# Patient Record
Sex: Female | Born: 1985 | Race: Black or African American | Hispanic: No | Marital: Single | Smoking: Current every day smoker
Health system: Southern US, Community
[De-identification: ages and names within clinical notes are randomized; demographics above are authoritative.]

## PROBLEM LIST (undated history)

## (undated) DIAGNOSIS — F319 Bipolar disorder, unspecified: Secondary | ICD-10-CM

## (undated) DIAGNOSIS — E119 Type 2 diabetes mellitus without complications: Secondary | ICD-10-CM

## (undated) DIAGNOSIS — M419 Scoliosis, unspecified: Secondary | ICD-10-CM

## (undated) DIAGNOSIS — D573 Sickle-cell trait: Secondary | ICD-10-CM

## (undated) DIAGNOSIS — J45909 Unspecified asthma, uncomplicated: Secondary | ICD-10-CM

## (undated) DIAGNOSIS — D571 Sickle-cell disease without crisis: Secondary | ICD-10-CM

## (undated) DIAGNOSIS — Z9851 Tubal ligation status: Secondary | ICD-10-CM

## (undated) HISTORY — PX: TUBAL LIGATION: SHX77

---

## 2005-12-09 ENCOUNTER — Emergency Department (HOSPITAL_COMMUNITY): Admission: EM | Admit: 2005-12-09 | Discharge: 2005-12-09 | Payer: Self-pay | Admitting: Emergency Medicine

## 2005-12-09 ENCOUNTER — Encounter: Payer: Self-pay | Admitting: Emergency Medicine

## 2006-03-13 ENCOUNTER — Emergency Department (HOSPITAL_COMMUNITY): Admission: EM | Admit: 2006-03-13 | Discharge: 2006-03-14 | Payer: Self-pay | Admitting: Emergency Medicine

## 2006-03-14 ENCOUNTER — Ambulatory Visit: Payer: Self-pay | Admitting: *Deleted

## 2006-03-14 ENCOUNTER — Encounter (INDEPENDENT_AMBULATORY_CARE_PROVIDER_SITE_OTHER): Payer: Self-pay | Admitting: Cardiology

## 2006-03-14 ENCOUNTER — Encounter (INDEPENDENT_AMBULATORY_CARE_PROVIDER_SITE_OTHER): Payer: Self-pay | Admitting: *Deleted

## 2006-03-14 ENCOUNTER — Inpatient Hospital Stay (HOSPITAL_COMMUNITY): Admission: EM | Admit: 2006-03-14 | Discharge: 2006-03-19 | Payer: Self-pay | Admitting: Emergency Medicine

## 2006-03-29 ENCOUNTER — Encounter (INDEPENDENT_AMBULATORY_CARE_PROVIDER_SITE_OTHER): Payer: Self-pay | Admitting: *Deleted

## 2006-03-29 ENCOUNTER — Ambulatory Visit: Payer: Self-pay | Admitting: Hospitalist

## 2006-03-29 DIAGNOSIS — F17200 Nicotine dependence, unspecified, uncomplicated: Secondary | ICD-10-CM

## 2006-03-29 DIAGNOSIS — E876 Hypokalemia: Secondary | ICD-10-CM

## 2006-03-29 DIAGNOSIS — N926 Irregular menstruation, unspecified: Secondary | ICD-10-CM | POA: Insufficient documentation

## 2006-03-29 DIAGNOSIS — N939 Abnormal uterine and vaginal bleeding, unspecified: Secondary | ICD-10-CM

## 2006-03-29 DIAGNOSIS — T7411XA Adult physical abuse, confirmed, initial encounter: Secondary | ICD-10-CM

## 2006-03-29 LAB — CONVERTED CEMR LAB
BUN: 1 mg/dL — ABNORMAL LOW (ref 6–23)
Creatinine, Ser: 0.64 mg/dL (ref 0.40–1.20)
Potassium: 3.5 meq/L (ref 3.5–5.3)

## 2006-03-30 ENCOUNTER — Telehealth (INDEPENDENT_AMBULATORY_CARE_PROVIDER_SITE_OTHER): Payer: Self-pay | Admitting: *Deleted

## 2006-03-30 ENCOUNTER — Ambulatory Visit: Payer: Self-pay | Admitting: Hematology & Oncology

## 2006-03-30 ENCOUNTER — Encounter (INDEPENDENT_AMBULATORY_CARE_PROVIDER_SITE_OTHER): Payer: Self-pay | Admitting: *Deleted

## 2006-03-30 LAB — CONVERTED CEMR LAB: Magnesium: 1.8 mg/dL (ref 1.5–2.5)

## 2006-04-12 ENCOUNTER — Encounter (INDEPENDENT_AMBULATORY_CARE_PROVIDER_SITE_OTHER): Payer: Self-pay | Admitting: *Deleted

## 2006-04-12 ENCOUNTER — Ambulatory Visit: Payer: Self-pay | Admitting: Internal Medicine

## 2006-04-13 LAB — CONVERTED CEMR LAB
CO2: 27 meq/L (ref 19–32)
Chloride: 106 meq/L (ref 96–112)
Glucose, Bld: 87 mg/dL (ref 70–99)
HCT: 33.3 % — ABNORMAL LOW (ref 36.0–46.0)
Lymphocytes Relative: 35 % (ref 12–46)
Lymphs Abs: 2.1 10*3/uL (ref 0.7–3.3)
Neutrophils Relative %: 53 % (ref 43–77)
Platelets: 380 10*3/uL (ref 150–400)
Potassium: 3.3 meq/L — ABNORMAL LOW (ref 3.5–5.3)
Sodium: 139 meq/L (ref 135–145)
WBC: 5.9 10*3/uL (ref 4.0–10.5)

## 2006-05-11 ENCOUNTER — Telehealth (INDEPENDENT_AMBULATORY_CARE_PROVIDER_SITE_OTHER): Payer: Self-pay | Admitting: Internal Medicine

## 2006-05-11 ENCOUNTER — Emergency Department (HOSPITAL_COMMUNITY): Admission: EM | Admit: 2006-05-11 | Discharge: 2006-05-11 | Payer: Self-pay | Admitting: Emergency Medicine

## 2006-05-12 ENCOUNTER — Telehealth: Payer: Self-pay | Admitting: *Deleted

## 2006-05-13 ENCOUNTER — Telehealth (INDEPENDENT_AMBULATORY_CARE_PROVIDER_SITE_OTHER): Payer: Self-pay | Admitting: Internal Medicine

## 2006-05-13 ENCOUNTER — Inpatient Hospital Stay (HOSPITAL_COMMUNITY): Admission: EM | Admit: 2006-05-13 | Discharge: 2006-05-15 | Payer: Self-pay | Admitting: Emergency Medicine

## 2006-05-18 ENCOUNTER — Telehealth: Payer: Self-pay | Admitting: *Deleted

## 2006-05-31 ENCOUNTER — Emergency Department (HOSPITAL_COMMUNITY): Admission: EM | Admit: 2006-05-31 | Discharge: 2006-06-01 | Payer: Self-pay | Admitting: Emergency Medicine

## 2006-06-08 ENCOUNTER — Emergency Department (HOSPITAL_COMMUNITY): Admission: EM | Admit: 2006-06-08 | Discharge: 2006-06-09 | Payer: Self-pay | Admitting: Emergency Medicine

## 2006-06-11 ENCOUNTER — Inpatient Hospital Stay (HOSPITAL_COMMUNITY): Admission: AD | Admit: 2006-06-11 | Discharge: 2006-06-12 | Payer: Self-pay | Admitting: Family Medicine

## 2006-07-19 ENCOUNTER — Inpatient Hospital Stay (HOSPITAL_COMMUNITY): Admission: EM | Admit: 2006-07-19 | Discharge: 2006-07-19 | Payer: Self-pay | Admitting: Obstetrics & Gynecology

## 2006-08-10 ENCOUNTER — Encounter: Payer: Self-pay | Admitting: Obstetrics & Gynecology

## 2006-08-10 ENCOUNTER — Ambulatory Visit: Payer: Self-pay | Admitting: Obstetrics & Gynecology

## 2006-09-08 ENCOUNTER — Ambulatory Visit: Payer: Self-pay | Admitting: Gynecology

## 2006-09-12 ENCOUNTER — Ambulatory Visit (HOSPITAL_COMMUNITY): Admission: RE | Admit: 2006-09-12 | Discharge: 2006-09-12 | Payer: Self-pay | Admitting: Family Medicine

## 2006-09-28 ENCOUNTER — Ambulatory Visit: Payer: Self-pay | Admitting: Gynecology

## 2006-09-29 ENCOUNTER — Ambulatory Visit: Payer: Self-pay | Admitting: *Deleted

## 2006-10-20 ENCOUNTER — Ambulatory Visit: Payer: Self-pay | Admitting: Family Medicine

## 2006-10-30 ENCOUNTER — Observation Stay (HOSPITAL_COMMUNITY): Admission: AD | Admit: 2006-10-30 | Discharge: 2006-10-31 | Payer: Self-pay | Admitting: Obstetrics & Gynecology

## 2006-10-30 ENCOUNTER — Ambulatory Visit: Payer: Self-pay | Admitting: Obstetrics & Gynecology

## 2006-10-30 ENCOUNTER — Encounter: Payer: Self-pay | Admitting: Emergency Medicine

## 2006-10-31 ENCOUNTER — Ambulatory Visit: Payer: Self-pay | Admitting: Hematology & Oncology

## 2006-11-17 ENCOUNTER — Ambulatory Visit: Payer: Self-pay | Admitting: Obstetrics & Gynecology

## 2006-11-26 ENCOUNTER — Inpatient Hospital Stay (HOSPITAL_COMMUNITY): Admission: AD | Admit: 2006-11-26 | Discharge: 2006-11-26 | Payer: Self-pay | Admitting: Family Medicine

## 2006-11-26 ENCOUNTER — Ambulatory Visit: Payer: Self-pay | Admitting: Obstetrics and Gynecology

## 2006-12-08 ENCOUNTER — Inpatient Hospital Stay (HOSPITAL_COMMUNITY): Admission: AD | Admit: 2006-12-08 | Discharge: 2006-12-08 | Payer: Self-pay | Admitting: Family Medicine

## 2006-12-08 ENCOUNTER — Ambulatory Visit: Payer: Self-pay | Admitting: Obstetrics and Gynecology

## 2007-01-05 ENCOUNTER — Inpatient Hospital Stay (HOSPITAL_COMMUNITY): Admission: AD | Admit: 2007-01-05 | Discharge: 2007-01-06 | Payer: Self-pay | Admitting: *Deleted

## 2007-01-05 ENCOUNTER — Ambulatory Visit: Payer: Self-pay | Admitting: *Deleted

## 2007-01-25 ENCOUNTER — Inpatient Hospital Stay (HOSPITAL_COMMUNITY): Admission: AD | Admit: 2007-01-25 | Discharge: 2007-01-26 | Payer: Self-pay | Admitting: Obstetrics and Gynecology

## 2007-01-30 ENCOUNTER — Ambulatory Visit (HOSPITAL_COMMUNITY): Admission: RE | Admit: 2007-01-30 | Discharge: 2007-01-30 | Payer: Self-pay | Admitting: Obstetrics & Gynecology

## 2007-01-30 ENCOUNTER — Ambulatory Visit: Payer: Self-pay | Admitting: Obstetrics & Gynecology

## 2007-02-01 ENCOUNTER — Inpatient Hospital Stay (HOSPITAL_COMMUNITY): Admission: AD | Admit: 2007-02-01 | Discharge: 2007-02-04 | Payer: Self-pay | Admitting: Obstetrics and Gynecology

## 2007-02-01 ENCOUNTER — Ambulatory Visit: Payer: Self-pay | Admitting: Obstetrics & Gynecology

## 2007-03-16 ENCOUNTER — Ambulatory Visit: Payer: Self-pay | Admitting: Obstetrics & Gynecology

## 2007-03-16 ENCOUNTER — Inpatient Hospital Stay (HOSPITAL_COMMUNITY): Admission: AD | Admit: 2007-03-16 | Discharge: 2007-03-16 | Payer: Self-pay | Admitting: Obstetrics & Gynecology

## 2007-06-15 ENCOUNTER — Ambulatory Visit: Payer: Self-pay | Admitting: Obstetrics and Gynecology

## 2007-09-09 ENCOUNTER — Inpatient Hospital Stay (HOSPITAL_COMMUNITY): Admission: EM | Admit: 2007-09-09 | Discharge: 2007-09-12 | Payer: Self-pay | Admitting: Emergency Medicine

## 2007-09-12 ENCOUNTER — Encounter (INDEPENDENT_AMBULATORY_CARE_PROVIDER_SITE_OTHER): Payer: Self-pay | Admitting: *Deleted

## 2007-09-12 DIAGNOSIS — D573 Sickle-cell trait: Secondary | ICD-10-CM

## 2007-09-12 DIAGNOSIS — K429 Umbilical hernia without obstruction or gangrene: Secondary | ICD-10-CM | POA: Insufficient documentation

## 2007-09-12 DIAGNOSIS — N898 Other specified noninflammatory disorders of vagina: Secondary | ICD-10-CM | POA: Insufficient documentation

## 2007-09-21 ENCOUNTER — Emergency Department (HOSPITAL_COMMUNITY): Admission: EM | Admit: 2007-09-21 | Discharge: 2007-09-22 | Payer: Self-pay | Admitting: Emergency Medicine

## 2007-12-09 ENCOUNTER — Emergency Department (HOSPITAL_COMMUNITY): Admission: EM | Admit: 2007-12-09 | Discharge: 2007-12-10 | Payer: Self-pay | Admitting: Emergency Medicine

## 2008-02-10 ENCOUNTER — Emergency Department (HOSPITAL_COMMUNITY): Admission: EM | Admit: 2008-02-10 | Discharge: 2008-02-11 | Payer: Self-pay | Admitting: Emergency Medicine

## 2008-02-17 ENCOUNTER — Emergency Department (HOSPITAL_COMMUNITY): Admission: EM | Admit: 2008-02-17 | Discharge: 2008-02-18 | Payer: Self-pay | Admitting: Emergency Medicine

## 2008-03-11 ENCOUNTER — Inpatient Hospital Stay (HOSPITAL_COMMUNITY): Admission: EM | Admit: 2008-03-11 | Discharge: 2008-03-15 | Payer: Self-pay | Admitting: Emergency Medicine

## 2008-03-22 ENCOUNTER — Emergency Department (HOSPITAL_COMMUNITY): Admission: EM | Admit: 2008-03-22 | Discharge: 2008-03-22 | Payer: Self-pay | Admitting: Emergency Medicine

## 2008-03-26 ENCOUNTER — Emergency Department (HOSPITAL_COMMUNITY): Admission: EM | Admit: 2008-03-26 | Discharge: 2008-03-27 | Payer: Self-pay | Admitting: Emergency Medicine

## 2008-03-27 ENCOUNTER — Emergency Department (HOSPITAL_COMMUNITY): Admission: EM | Admit: 2008-03-27 | Discharge: 2008-03-28 | Payer: Self-pay | Admitting: Emergency Medicine

## 2008-04-14 ENCOUNTER — Emergency Department (HOSPITAL_COMMUNITY): Admission: EM | Admit: 2008-04-14 | Discharge: 2008-04-14 | Payer: Self-pay | Admitting: Emergency Medicine

## 2008-04-26 ENCOUNTER — Emergency Department (HOSPITAL_COMMUNITY): Admission: EM | Admit: 2008-04-26 | Discharge: 2008-04-26 | Payer: Self-pay | Admitting: *Deleted

## 2008-05-27 ENCOUNTER — Emergency Department (HOSPITAL_COMMUNITY): Admission: EM | Admit: 2008-05-27 | Discharge: 2008-05-27 | Payer: Self-pay | Admitting: Emergency Medicine

## 2008-08-07 ENCOUNTER — Emergency Department (HOSPITAL_COMMUNITY): Admission: EM | Admit: 2008-08-07 | Discharge: 2008-08-07 | Payer: Self-pay | Admitting: Emergency Medicine

## 2008-09-06 ENCOUNTER — Emergency Department (HOSPITAL_COMMUNITY): Admission: EM | Admit: 2008-09-06 | Discharge: 2008-09-06 | Payer: Self-pay | Admitting: Emergency Medicine

## 2008-10-12 ENCOUNTER — Emergency Department (HOSPITAL_COMMUNITY): Admission: EM | Admit: 2008-10-12 | Discharge: 2008-10-13 | Payer: Self-pay | Admitting: Emergency Medicine

## 2008-11-03 ENCOUNTER — Emergency Department (HOSPITAL_COMMUNITY): Admission: EM | Admit: 2008-11-03 | Discharge: 2008-11-03 | Payer: Self-pay | Admitting: Emergency Medicine

## 2009-01-04 ENCOUNTER — Emergency Department (HOSPITAL_COMMUNITY): Admission: EM | Admit: 2009-01-04 | Discharge: 2009-01-04 | Payer: Self-pay | Admitting: Emergency Medicine

## 2009-01-22 ENCOUNTER — Emergency Department (HOSPITAL_COMMUNITY): Admission: EM | Admit: 2009-01-22 | Discharge: 2009-01-22 | Payer: Self-pay | Admitting: Emergency Medicine

## 2009-01-23 ENCOUNTER — Emergency Department (HOSPITAL_COMMUNITY): Admission: EM | Admit: 2009-01-23 | Discharge: 2009-01-23 | Payer: Self-pay | Admitting: Emergency Medicine

## 2009-04-12 ENCOUNTER — Inpatient Hospital Stay (HOSPITAL_COMMUNITY): Admission: AD | Admit: 2009-04-12 | Discharge: 2009-04-12 | Payer: Self-pay | Admitting: Obstetrics and Gynecology

## 2009-08-31 ENCOUNTER — Inpatient Hospital Stay (HOSPITAL_COMMUNITY): Admission: EM | Admit: 2009-08-31 | Discharge: 2009-09-02 | Payer: Self-pay | Admitting: Emergency Medicine

## 2009-09-02 ENCOUNTER — Ambulatory Visit: Payer: Self-pay | Admitting: Vascular Surgery

## 2009-09-02 ENCOUNTER — Encounter (INDEPENDENT_AMBULATORY_CARE_PROVIDER_SITE_OTHER): Payer: Self-pay | Admitting: Internal Medicine

## 2009-10-27 ENCOUNTER — Emergency Department (HOSPITAL_COMMUNITY): Admission: EM | Admit: 2009-10-27 | Discharge: 2009-10-27 | Payer: Self-pay | Admitting: Emergency Medicine

## 2009-11-22 ENCOUNTER — Emergency Department (HOSPITAL_COMMUNITY): Admission: EM | Admit: 2009-11-22 | Discharge: 2009-11-22 | Payer: Self-pay | Admitting: Emergency Medicine

## 2010-03-08 ENCOUNTER — Encounter: Payer: Self-pay | Admitting: Family Medicine

## 2010-04-30 LAB — HCG, SERUM, QUALITATIVE: Preg, Serum: NEGATIVE

## 2010-04-30 LAB — URINALYSIS, ROUTINE W REFLEX MICROSCOPIC
Bilirubin Urine: NEGATIVE
Glucose, UA: NEGATIVE mg/dL
Hgb urine dipstick: NEGATIVE
Nitrite: NEGATIVE
Specific Gravity, Urine: 1.018 (ref 1.005–1.030)
Urobilinogen, UA: 0.2 mg/dL (ref 0.0–1.0)
pH: 6.5 (ref 5.0–8.0)

## 2010-04-30 LAB — RAPID URINE DRUG SCREEN, HOSP PERFORMED
Barbiturates: NOT DETECTED
Opiates: NOT DETECTED
Tetrahydrocannabinol: POSITIVE — AB

## 2010-04-30 LAB — CBC
MCH: 28 pg (ref 26.0–34.0)
MCHC: 33.3 g/dL (ref 30.0–36.0)
MCV: 84 fL (ref 78.0–100.0)
Platelets: 255 10*3/uL (ref 150–400)
RBC: 4.16 MIL/uL (ref 3.87–5.11)

## 2010-04-30 LAB — POCT I-STAT, CHEM 8
BUN: 3 mg/dL — ABNORMAL LOW (ref 6–23)
Calcium, Ion: 1.12 mmol/L (ref 1.12–1.32)
Chloride: 105 mEq/L (ref 96–112)
Glucose, Bld: 83 mg/dL (ref 70–99)

## 2010-04-30 LAB — BASIC METABOLIC PANEL
CO2: 27 mEq/L (ref 19–32)
Calcium: 7.9 mg/dL — ABNORMAL LOW (ref 8.4–10.5)
Chloride: 112 mEq/L (ref 96–112)
Creatinine, Ser: 0.69 mg/dL (ref 0.4–1.2)
Glucose, Bld: 88 mg/dL (ref 70–99)

## 2010-04-30 LAB — URINE MICROSCOPIC-ADD ON

## 2010-05-02 LAB — DIFFERENTIAL
Basophils Absolute: 0 10*3/uL (ref 0.0–0.1)
Basophils Relative: 0 % (ref 0–1)
Eosinophils Absolute: 0.1 10*3/uL (ref 0.0–0.7)
Eosinophils Relative: 1 % (ref 0–5)
Monocytes Absolute: 0.7 10*3/uL (ref 0.1–1.0)
Monocytes Relative: 7 % (ref 3–12)
Neutro Abs: 6 10*3/uL (ref 1.7–7.7)

## 2010-05-02 LAB — URINALYSIS, ROUTINE W REFLEX MICROSCOPIC
Glucose, UA: NEGATIVE mg/dL
Ketones, ur: 40 mg/dL — AB
Nitrite: NEGATIVE
Specific Gravity, Urine: 1.023 (ref 1.005–1.030)
pH: 5.5 (ref 5.0–8.0)

## 2010-05-02 LAB — BASIC METABOLIC PANEL
BUN: 2 mg/dL — ABNORMAL LOW (ref 6–23)
BUN: 9 mg/dL (ref 6–23)
CO2: 22 mEq/L (ref 19–32)
CO2: 25 mEq/L (ref 19–32)
Calcium: 8.1 mg/dL — ABNORMAL LOW (ref 8.4–10.5)
Creatinine, Ser: 0.81 mg/dL (ref 0.4–1.2)
GFR calc non Af Amer: >60 mL/min (ref >60)
GFR calc non Af Amer: >60 mL/min (ref >60)
Glucose, Bld: 75 mg/dL (ref 70–99)
Glucose, Bld: 93 mg/dL (ref 70–99)
Potassium: 3.2 mEq/L — ABNORMAL LOW (ref 3.5–5.1)
Sodium: 138 mEq/L (ref 135–145)
Sodium: 139 mEq/L (ref 135–145)

## 2010-05-02 LAB — CARDIAC PANEL(CRET KIN+CKTOT+MB+TROPI)
CK, MB: 1.1 ng/mL (ref 0.3–4.0)
Relative Index: 0.7 (ref 0.0–2.5)
Troponin I: 0.03 ng/mL (ref 0.00–0.06)

## 2010-05-02 LAB — CBC
HCT: 31.9 % — ABNORMAL LOW (ref 36.0–46.0)
HCT: 36.8 % (ref 36.0–46.0)
Hemoglobin: 10.4 g/dL — ABNORMAL LOW (ref 12.0–15.0)
Hemoglobin: 12.3 g/dL (ref 12.0–15.0)
MCH: 28.1 pg (ref 26.0–34.0)
MCH: 28.7 pg (ref 26.0–34.0)
MCHC: 32.6 g/dL (ref 30.0–36.0)
MCHC: 33.5 g/dL (ref 30.0–36.0)
MCV: 86.2 fL (ref 78.0–100.0)
RBC: 3.7 MIL/uL — ABNORMAL LOW (ref 3.87–5.11)
RDW: 18.2 % — ABNORMAL HIGH (ref 11.5–15.5)

## 2010-05-02 LAB — COMPREHENSIVE METABOLIC PANEL
Albumin: 3 g/dL — ABNORMAL LOW (ref 3.5–5.2)
BUN: 4 mg/dL — ABNORMAL LOW (ref 6–23)
Chloride: 113 mEq/L — ABNORMAL HIGH (ref 96–112)
Creatinine, Ser: 0.81 mg/dL (ref 0.4–1.2)
GFR calc Af Amer: >60 mL/min (ref >60)
Glucose, Bld: 99 mg/dL (ref 70–99)
Potassium: 3.9 mEq/L (ref 3.5–5.1)
Sodium: 141 mEq/L (ref 135–145)
Total Bilirubin: 0.5 mg/dL (ref 0.3–1.2)
Total Protein: 5.8 g/dL — ABNORMAL LOW (ref 6.0–8.3)

## 2010-05-02 LAB — RAPID URINE DRUG SCREEN, HOSP PERFORMED: Cocaine: NOT DETECTED

## 2010-05-02 LAB — MAGNESIUM: Magnesium: 1.7 mg/dL (ref 1.5–2.5)

## 2010-05-02 LAB — POCT CARDIAC MARKERS: Myoglobin, poc: 81.9 ng/mL (ref 12–200)

## 2010-05-02 LAB — TROPONIN I: Troponin I: 0.01 ng/mL (ref 0.00–0.06)

## 2010-05-02 LAB — URINE MICROSCOPIC-ADD ON

## 2010-05-02 LAB — CK TOTAL AND CKMB (NOT AT ARMC): Total CK: 146 U/L (ref 7–177)

## 2010-05-02 LAB — MRSA PCR SCREENING: MRSA by PCR: POSITIVE — AB

## 2010-05-02 LAB — POCT PREGNANCY, URINE: Preg Test, Ur: NEGATIVE

## 2010-05-02 LAB — GC/CHLAMYDIA PROBE AMP, URINE: GC Probe Amp, Urine: NEGATIVE

## 2010-05-06 LAB — URINALYSIS, ROUTINE W REFLEX MICROSCOPIC
Ketones, ur: NEGATIVE mg/dL
Leukocytes, UA: NEGATIVE
Nitrite: NEGATIVE
Specific Gravity, Urine: 1.01 (ref 1.005–1.030)
Urobilinogen, UA: 0.2 mg/dL (ref 0.0–1.0)
pH: 7.5 (ref 5.0–8.0)

## 2010-05-06 LAB — WET PREP, GENITAL
Clue Cells Wet Prep HPF POC: NONE SEEN
Yeast Wet Prep HPF POC: NONE SEEN

## 2010-05-06 LAB — CBC
Platelets: 305 10*3/uL (ref 150–400)
RBC: 4.05 MIL/uL (ref 3.87–5.11)
WBC: 6.6 10*3/uL (ref 4.0–10.5)

## 2010-05-06 LAB — URINE MICROSCOPIC-ADD ON

## 2010-05-06 LAB — SAMPLE TO BLOOD BANK

## 2010-05-06 LAB — GC/CHLAMYDIA PROBE AMP, GENITAL: Chlamydia, DNA Probe: POSITIVE — AB

## 2010-05-06 LAB — POCT PREGNANCY, URINE: Preg Test, Ur: NEGATIVE

## 2010-05-19 LAB — URINALYSIS, ROUTINE W REFLEX MICROSCOPIC
Glucose, UA: NEGATIVE mg/dL
Ketones, ur: NEGATIVE mg/dL
Nitrite: NEGATIVE
Protein, ur: NEGATIVE mg/dL
Urobilinogen, UA: 0.2 mg/dL (ref 0.0–1.0)

## 2010-05-19 LAB — RETICULOCYTES
RBC.: 4.31 MIL/uL (ref 3.87–5.11)
Retic Count, Absolute: 60.3 10*3/uL (ref 19.0–186.0)
Retic Ct Pct: 1.4 % (ref 0.4–3.1)

## 2010-05-19 LAB — DIFFERENTIAL
Basophils Absolute: 0.1 10*3/uL (ref 0.0–0.1)
Lymphocytes Relative: 20 % (ref 12–46)
Lymphs Abs: 1.9 10*3/uL (ref 0.7–4.0)
Neutro Abs: 6.5 10*3/uL (ref 1.7–7.7)
Neutrophils Relative %: 71 % (ref 43–77)

## 2010-05-19 LAB — BASIC METABOLIC PANEL
BUN: 7 mg/dL (ref 6–23)
Calcium: 8.6 mg/dL (ref 8.4–10.5)
GFR calc non Af Amer: >60 mL/min (ref >60)
Glucose, Bld: 89 mg/dL (ref 70–99)
Potassium: 4.1 mEq/L (ref 3.5–5.1)

## 2010-05-19 LAB — CBC
HCT: 36.7 % (ref 36.0–46.0)
Platelets: 366 10*3/uL (ref 150–400)
RDW: 15.9 % — ABNORMAL HIGH (ref 11.5–15.5)
WBC: 9.2 10*3/uL (ref 4.0–10.5)

## 2010-05-19 LAB — URINE MICROSCOPIC-ADD ON

## 2010-05-19 LAB — POCT PREGNANCY, URINE: Preg Test, Ur: NEGATIVE

## 2010-05-20 LAB — URINALYSIS, ROUTINE W REFLEX MICROSCOPIC
Bilirubin Urine: NEGATIVE
Glucose, UA: NEGATIVE mg/dL
Ketones, ur: NEGATIVE mg/dL
Nitrite: NEGATIVE
Protein, ur: NEGATIVE mg/dL
pH: 8 (ref 5.0–8.0)

## 2010-05-20 LAB — WET PREP, GENITAL
Clue Cells Wet Prep HPF POC: NONE SEEN
Trich, Wet Prep: NONE SEEN
Yeast Wet Prep HPF POC: NONE SEEN

## 2010-05-20 LAB — POCT PREGNANCY, URINE: Preg Test, Ur: NEGATIVE

## 2010-05-22 LAB — URINALYSIS, ROUTINE W REFLEX MICROSCOPIC
Bilirubin Urine: NEGATIVE
Glucose, UA: NEGATIVE mg/dL
Hgb urine dipstick: NEGATIVE
Specific Gravity, Urine: 1.016 (ref 1.005–1.030)

## 2010-05-22 LAB — GC/CHLAMYDIA PROBE AMP, GENITAL
Chlamydia, DNA Probe: NEGATIVE
GC Probe Amp, Genital: NEGATIVE

## 2010-05-22 LAB — WET PREP, GENITAL

## 2010-05-23 LAB — CBC
HCT: 36.4 % (ref 36.0–46.0)
MCV: 83.5 fL (ref 78.0–100.0)
Platelets: 233 10*3/uL (ref 150–400)
RDW: 17.1 % — ABNORMAL HIGH (ref 11.5–15.5)

## 2010-05-23 LAB — DIFFERENTIAL
Basophils Absolute: 0 10*3/uL (ref 0.0–0.1)
Basophils Relative: 0 % (ref 0–1)
Eosinophils Absolute: 0 10*3/uL (ref 0.0–0.7)
Eosinophils Relative: 0 % (ref 0–5)

## 2010-05-23 LAB — POCT I-STAT, CHEM 8
Creatinine, Ser: 0.8 mg/dL (ref 0.4–1.2)
Glucose, Bld: 81 mg/dL (ref 70–99)
Hemoglobin: 12.2 g/dL (ref 12.0–15.0)
TCO2: 24 mmol/L (ref 0–100)

## 2010-05-23 LAB — URINALYSIS, ROUTINE W REFLEX MICROSCOPIC
Bilirubin Urine: NEGATIVE
Glucose, UA: NEGATIVE mg/dL
Hgb urine dipstick: NEGATIVE
Ketones, ur: 40 mg/dL — AB
Protein, ur: NEGATIVE mg/dL

## 2010-05-23 LAB — WET PREP, GENITAL: Yeast Wet Prep HPF POC: NONE SEEN

## 2010-05-23 LAB — GC/CHLAMYDIA PROBE AMP, GENITAL: GC Probe Amp, Genital: NEGATIVE

## 2010-05-24 LAB — RAPID URINE DRUG SCREEN, HOSP PERFORMED
Amphetamines: NOT DETECTED
Benzodiazepines: NOT DETECTED
Cocaine: NOT DETECTED
Opiates: NOT DETECTED
Tetrahydrocannabinol: NOT DETECTED

## 2010-05-24 LAB — BASIC METABOLIC PANEL
CO2: 28 mEq/L (ref 19–32)
Calcium: 8.7 mg/dL (ref 8.4–10.5)
Creatinine, Ser: 0.69 mg/dL (ref 0.4–1.2)
Glucose, Bld: 95 mg/dL (ref 70–99)
Sodium: 136 mEq/L (ref 135–145)

## 2010-05-24 LAB — DIFFERENTIAL
Basophils Absolute: 0 10*3/uL (ref 0.0–0.1)
Basophils Relative: 1 % (ref 0–1)
Monocytes Absolute: 0.6 10*3/uL (ref 0.1–1.0)
Neutro Abs: 3.9 10*3/uL (ref 1.7–7.7)

## 2010-05-24 LAB — URINALYSIS, ROUTINE W REFLEX MICROSCOPIC
Bilirubin Urine: NEGATIVE
Glucose, UA: NEGATIVE mg/dL
Hgb urine dipstick: NEGATIVE
Ketones, ur: NEGATIVE mg/dL
Nitrite: NEGATIVE
pH: 6 (ref 5.0–8.0)

## 2010-05-24 LAB — CBC
Hemoglobin: 11.7 g/dL — ABNORMAL LOW (ref 12.0–15.0)
MCHC: 32.7 g/dL (ref 30.0–36.0)
RDW: 18.2 % — ABNORMAL HIGH (ref 11.5–15.5)

## 2010-05-24 LAB — RETICULOCYTES: Retic Count, Absolute: 37.9 10*3/uL (ref 19.0–186.0)

## 2010-05-25 LAB — POCT PREGNANCY, URINE: Preg Test, Ur: NEGATIVE

## 2010-05-25 LAB — CBC
Hemoglobin: 11.7 g/dL — ABNORMAL LOW (ref 12.0–15.0)
RBC: 4.34 MIL/uL (ref 3.87–5.11)
RDW: 18.7 % — ABNORMAL HIGH (ref 11.5–15.5)
WBC: 7.1 10*3/uL (ref 4.0–10.5)

## 2010-05-25 LAB — COMPREHENSIVE METABOLIC PANEL
ALT: 12 U/L (ref 0–35)
Alkaline Phosphatase: 70 U/L (ref 39–117)
Chloride: 110 mEq/L (ref 96–112)
Glucose, Bld: 83 mg/dL (ref 70–99)
Potassium: 3.6 mEq/L (ref 3.5–5.1)
Sodium: 142 mEq/L (ref 135–145)
Total Bilirubin: 0.5 mg/dL (ref 0.3–1.2)
Total Protein: 7 g/dL (ref 6.0–8.3)

## 2010-05-25 LAB — DIFFERENTIAL
Basophils Relative: 1 % (ref 0–1)
Eosinophils Absolute: 0.1 10*3/uL (ref 0.0–0.7)
Monocytes Absolute: 0.7 10*3/uL (ref 0.1–1.0)
Monocytes Relative: 9 % (ref 3–12)
Neutrophils Relative %: 54 % (ref 43–77)

## 2010-05-25 LAB — URINALYSIS, ROUTINE W REFLEX MICROSCOPIC
Ketones, ur: NEGATIVE mg/dL
Nitrite: NEGATIVE
Urobilinogen, UA: 0.2 mg/dL (ref 0.0–1.0)

## 2010-05-25 LAB — PHOSPHORUS: Phosphorus: 3.3 mg/dL (ref 2.3–4.6)

## 2010-05-27 LAB — POCT PREGNANCY, URINE: Preg Test, Ur: NEGATIVE

## 2010-05-27 LAB — URINALYSIS, ROUTINE W REFLEX MICROSCOPIC
Bilirubin Urine: NEGATIVE
Glucose, UA: NEGATIVE mg/dL
Ketones, ur: NEGATIVE mg/dL
pH: 6.5 (ref 5.0–8.0)

## 2010-05-27 LAB — URINE MICROSCOPIC-ADD ON

## 2010-05-28 LAB — COMPREHENSIVE METABOLIC PANEL
Albumin: 3.4 g/dL — ABNORMAL LOW (ref 3.5–5.2)
Alkaline Phosphatase: 79 U/L (ref 39–117)
BUN: 5 mg/dL — ABNORMAL LOW (ref 6–23)
Chloride: 108 mEq/L (ref 96–112)
Potassium: 3.7 mEq/L (ref 3.5–5.1)
Total Bilirubin: 0.4 mg/dL (ref 0.3–1.2)

## 2010-05-28 LAB — CBC
HCT: 31.4 % — ABNORMAL LOW (ref 36.0–46.0)
Hemoglobin: 10.6 g/dL — ABNORMAL LOW (ref 12.0–15.0)
Platelets: 317 10*3/uL (ref 150–400)
RBC: 3.84 MIL/uL — ABNORMAL LOW (ref 3.87–5.11)
WBC: 7.2 10*3/uL (ref 4.0–10.5)

## 2010-05-28 LAB — DIFFERENTIAL
Basophils Absolute: 0.1 10*3/uL (ref 0.0–0.1)
Basophils Relative: 1 % (ref 0–1)
Eosinophils Relative: 5 % (ref 0–5)
Monocytes Absolute: 0.5 10*3/uL (ref 0.1–1.0)
Neutro Abs: 3.4 10*3/uL (ref 1.7–7.7)

## 2010-05-28 LAB — URINALYSIS, ROUTINE W REFLEX MICROSCOPIC
Protein, ur: NEGATIVE mg/dL
Urobilinogen, UA: 0.2 mg/dL (ref 0.0–1.0)

## 2010-05-28 LAB — POCT I-STAT, CHEM 8
Calcium, Ion: 1.04 mmol/L — ABNORMAL LOW (ref 1.12–1.32)
Glucose, Bld: 91 mg/dL (ref 70–99)
HCT: 35 % — ABNORMAL LOW (ref 36.0–46.0)
Hemoglobin: 11.9 g/dL — ABNORMAL LOW (ref 12.0–15.0)
TCO2: 24 mmol/L (ref 0–100)

## 2010-06-01 LAB — COMPREHENSIVE METABOLIC PANEL
ALT: 10 U/L (ref 0–35)
ALT: 9 U/L (ref 0–35)
AST: 17 U/L (ref 0–37)
Albumin: 2.3 g/dL — ABNORMAL LOW (ref 3.5–5.2)
Albumin: 3.6 g/dL (ref 3.5–5.2)
Alkaline Phosphatase: 56 U/L (ref 39–117)
Alkaline Phosphatase: 75 U/L (ref 39–117)
CO2: 24 mEq/L (ref 19–32)
Creatinine, Ser: 0.81 mg/dL (ref 0.4–1.2)
GFR calc Af Amer: >60 mL/min (ref >60)
Glucose, Bld: 104 mg/dL — ABNORMAL HIGH (ref 70–99)
Potassium: 3.3 mEq/L — ABNORMAL LOW (ref 3.5–5.1)
Sodium: 140 mEq/L (ref 135–145)
Total Bilirubin: 1 mg/dL (ref 0.3–1.2)
Total Protein: 5 g/dL — ABNORMAL LOW (ref 6.0–8.3)

## 2010-06-01 LAB — CULTURE, BLOOD (ROUTINE X 2)

## 2010-06-01 LAB — CBC
HCT: 29.6 % — ABNORMAL LOW (ref 36.0–46.0)
HCT: 30 % — ABNORMAL LOW (ref 36.0–46.0)
HCT: 34.7 % — ABNORMAL LOW (ref 36.0–46.0)
Hemoglobin: 10.9 g/dL — ABNORMAL LOW (ref 12.0–15.0)
Hemoglobin: 11.5 g/dL — ABNORMAL LOW (ref 12.0–15.0)
Hemoglobin: 9.7 g/dL — ABNORMAL LOW (ref 12.0–15.0)
Hemoglobin: 9.9 g/dL — ABNORMAL LOW (ref 12.0–15.0)
MCHC: 33.6 g/dL (ref 30.0–36.0)
MCHC: 32.8 g/dL (ref 30.0–36.0)
MCHC: 33.2 g/dL (ref 30.0–36.0)
MCV: 82.6 fL (ref 78.0–100.0)
MCV: 82.6 fL (ref 78.0–100.0)
MCV: 83.1 fL (ref 78.0–100.0)
MCV: 83.8 fL (ref 78.0–100.0)
Platelets: 242 10*3/uL (ref 150–400)
Platelets: 242 10*3/uL (ref 150–400)
RBC: 3.91 MIL/uL (ref 3.87–5.11)
RBC: 3.55 MIL/uL — ABNORMAL LOW (ref 3.87–5.11)
RBC: 3.57 MIL/uL — ABNORMAL LOW (ref 3.87–5.11)
RBC: 4.2 MIL/uL (ref 3.87–5.11)
RDW: 17.8 % — ABNORMAL HIGH (ref 11.5–15.5)
RDW: 18.7 % — ABNORMAL HIGH (ref 11.5–15.5)
RDW: 18.7 % — ABNORMAL HIGH (ref 11.5–15.5)
RDW: 18.8 % — ABNORMAL HIGH (ref 11.5–15.5)
WBC: 15.2 10*3/uL — ABNORMAL HIGH (ref 4.0–10.5)
WBC: 7.8 10*3/uL (ref 4.0–10.5)
WBC: 8.6 10*3/uL (ref 4.0–10.5)

## 2010-06-01 LAB — DIFFERENTIAL
Basophils Absolute: 0 10*3/uL (ref 0.0–0.1)
Basophils Relative: 0 % (ref 0–1)
Eosinophils Absolute: 0 10*3/uL (ref 0.0–0.7)
Eosinophils Absolute: 0 10*3/uL (ref 0.0–0.7)
Eosinophils Relative: 0 % (ref 0–5)
Eosinophils Relative: 0 % (ref 0–5)
Lymphocytes Relative: 6 % — ABNORMAL LOW (ref 12–46)
Lymphs Abs: 1.5 10*3/uL (ref 0.7–4.0)
Lymphs Abs: 1.4 10*3/uL (ref 0.7–4.0)
Monocytes Absolute: 1.9 10*3/uL — ABNORMAL HIGH (ref 0.1–1.0)
Monocytes Absolute: 1.2 10*3/uL — ABNORMAL HIGH (ref 0.1–1.0)
Monocytes Absolute: 1.3 10*3/uL — ABNORMAL HIGH (ref 0.1–1.0)
Monocytes Relative: 11 % (ref 3–12)
Monocytes Relative: 8 % (ref 3–12)
Neutro Abs: 13.4 10*3/uL — ABNORMAL HIGH (ref 1.7–7.7)

## 2010-06-01 LAB — URINALYSIS, ROUTINE W REFLEX MICROSCOPIC
Bilirubin Urine: NEGATIVE
Bilirubin Urine: NEGATIVE
Glucose, UA: NEGATIVE mg/dL
Hgb urine dipstick: NEGATIVE
Ketones, ur: 15 mg/dL — AB
Ketones, ur: 15 mg/dL — AB
Nitrite: POSITIVE — AB
Nitrite: NEGATIVE
Nitrite: POSITIVE — AB
Protein, ur: NEGATIVE mg/dL
Specific Gravity, Urine: 1.007 (ref 1.005–1.030)
Urobilinogen, UA: 1 mg/dL (ref 0.0–1.0)
Urobilinogen, UA: 0.2 mg/dL (ref 0.0–1.0)
Urobilinogen, UA: 1 mg/dL (ref 0.0–1.0)
pH: 6 (ref 5.0–8.0)
pH: 6.5 (ref 5.0–8.0)

## 2010-06-01 LAB — RETICULOCYTES
RBC.: 3.64 MIL/uL — ABNORMAL LOW (ref 3.87–5.11)
Retic Count, Absolute: 32.8 10*3/uL (ref 19.0–186.0)

## 2010-06-01 LAB — POCT I-STAT, CHEM 8
Calcium, Ion: 1.03 mmol/L — ABNORMAL LOW (ref 1.12–1.32)
Chloride: 102 mEq/L (ref 96–112)
Creatinine, Ser: 0.9 mg/dL (ref 0.4–1.2)
Glucose, Bld: 114 mg/dL — ABNORMAL HIGH (ref 70–99)
HCT: 35 % — ABNORMAL LOW (ref 36.0–46.0)

## 2010-06-01 LAB — BASIC METABOLIC PANEL
BUN: 3 mg/dL — ABNORMAL LOW (ref 6–23)
BUN: <1 mg/dL — ABNORMAL LOW (ref 6–23)
Calcium: 8.2 mg/dL — ABNORMAL LOW (ref 8.4–10.5)
Chloride: 107 mEq/L (ref 96–112)
Chloride: 109 mEq/L (ref 96–112)
Chloride: 111 mEq/L (ref 96–112)
Creatinine, Ser: 0.64 mg/dL (ref 0.4–1.2)
Creatinine, Ser: 0.66 mg/dL (ref 0.4–1.2)
GFR calc Af Amer: >60 mL/min (ref >60)
GFR calc Af Amer: >60 mL/min (ref >60)
GFR calc non Af Amer: >60 mL/min (ref >60)
GFR calc non Af Amer: >60 mL/min (ref >60)
Glucose, Bld: 95 mg/dL (ref 70–99)
Potassium: 3.9 mEq/L (ref 3.5–5.1)

## 2010-06-01 LAB — URINE MICROSCOPIC-ADD ON

## 2010-06-01 LAB — URINE CULTURE: Colony Count: >=100000

## 2010-06-01 LAB — LIPASE, BLOOD: Lipase: 14 U/L (ref 11–59)

## 2010-06-01 LAB — AMYLASE: Amylase: 57 U/L (ref 27–131)

## 2010-06-01 LAB — FERRITIN: Ferritin: 59 ng/mL (ref 10–291)

## 2010-06-02 LAB — URINALYSIS, ROUTINE W REFLEX MICROSCOPIC
Bilirubin Urine: NEGATIVE
Bilirubin Urine: NEGATIVE
Glucose, UA: NEGATIVE mg/dL
Hgb urine dipstick: NEGATIVE
Hgb urine dipstick: NEGATIVE
Ketones, ur: NEGATIVE mg/dL
Ketones, ur: NEGATIVE mg/dL
Leukocytes, UA: NEGATIVE
Nitrite: NEGATIVE
Nitrite: NEGATIVE
Protein, ur: NEGATIVE mg/dL
Protein, ur: NEGATIVE mg/dL
Specific Gravity, Urine: 1.017 (ref 1.005–1.030)
Urobilinogen, UA: 0.2 mg/dL (ref 0.0–1.0)
Urobilinogen, UA: 0.2 mg/dL (ref 0.0–1.0)
pH: 8.5 — ABNORMAL HIGH (ref 5.0–8.0)

## 2010-06-02 LAB — COMPREHENSIVE METABOLIC PANEL
ALT: 10 U/L (ref 0–35)
ALT: 8 U/L (ref 0–35)
AST: 18 U/L (ref 0–37)
Albumin: 3.2 g/dL — ABNORMAL LOW (ref 3.5–5.2)
Alkaline Phosphatase: 75 U/L (ref 39–117)
BUN: 4 mg/dL — ABNORMAL LOW (ref 6–23)
CO2: 27 mEq/L (ref 19–32)
Calcium: 8.4 mg/dL (ref 8.4–10.5)
Calcium: 8.4 mg/dL (ref 8.4–10.5)
Chloride: 106 mEq/L (ref 96–112)
Creatinine, Ser: 0.71 mg/dL (ref 0.4–1.2)
GFR calc Af Amer: >60 mL/min (ref >60)
GFR calc Af Amer: >60 mL/min (ref >60)
GFR calc non Af Amer: >60 mL/min (ref >60)
Glucose, Bld: 90 mg/dL (ref 70–99)
Glucose, Bld: 93 mg/dL (ref 70–99)
Potassium: 4 mEq/L (ref 3.5–5.1)
Sodium: 140 mEq/L (ref 135–145)
Sodium: 142 mEq/L (ref 135–145)
Total Bilirubin: 0.5 mg/dL (ref 0.3–1.2)
Total Protein: 5.9 g/dL — ABNORMAL LOW (ref 6.0–8.3)
Total Protein: 6 g/dL (ref 6.0–8.3)

## 2010-06-02 LAB — DIFFERENTIAL
Basophils Absolute: 0.1 10*3/uL (ref 0.0–0.1)
Basophils Relative: 1 % (ref 0–1)
Basophils Relative: 1 % (ref 0–1)
Eosinophils Absolute: 0.1 10*3/uL (ref 0.0–0.7)
Eosinophils Absolute: 0.1 10*3/uL (ref 0.0–0.7)
Eosinophils Absolute: 0.2 10*3/uL (ref 0.0–0.7)
Eosinophils Relative: 2 % (ref 0–5)
Lymphocytes Relative: 43 % (ref 12–46)
Lymphs Abs: 2.1 10*3/uL (ref 0.7–4.0)
Lymphs Abs: 2.8 10*3/uL (ref 0.7–4.0)
Lymphs Abs: 2.3 10*3/uL (ref 0.7–4.0)
Monocytes Absolute: 0.6 10*3/uL (ref 0.1–1.0)
Monocytes Relative: 7 % (ref 3–12)
Monocytes Relative: 9 % (ref 3–12)
Monocytes Relative: 7 % (ref 3–12)
Neutro Abs: 3.1 10*3/uL (ref 1.7–7.7)
Neutro Abs: 2.8 10*3/uL (ref 1.7–7.7)
Neutrophils Relative %: 46 % (ref 43–77)
Neutrophils Relative %: 47 % (ref 43–77)
Neutrophils Relative %: 48 % (ref 43–77)

## 2010-06-02 LAB — CBC
HCT: 32.2 % — ABNORMAL LOW (ref 36.0–46.0)
HCT: 32.9 % — ABNORMAL LOW (ref 36.0–46.0)
Hemoglobin: 10.5 g/dL — ABNORMAL LOW (ref 12.0–15.0)
Hemoglobin: 10.8 g/dL — ABNORMAL LOW (ref 12.0–15.0)
Hemoglobin: 11.2 g/dL — ABNORMAL LOW (ref 12.0–15.0)
MCHC: 32.6 g/dL (ref 30.0–36.0)
MCHC: 32.7 g/dL (ref 30.0–36.0)
MCHC: 34 g/dL (ref 30.0–36.0)
MCV: 83.1 fL (ref 78.0–100.0)
MCV: 81.6 fL (ref 78.0–100.0)
Platelets: 443 10*3/uL — ABNORMAL HIGH (ref 150–400)
Platelets: 325 10*3/uL (ref 150–400)
RBC: 3.88 MIL/uL (ref 3.87–5.11)
RBC: 4.03 MIL/uL (ref 3.87–5.11)
RDW: 18 % — ABNORMAL HIGH (ref 11.5–15.5)
RDW: 18.2 % — ABNORMAL HIGH (ref 11.5–15.5)
RDW: 17.6 % — ABNORMAL HIGH (ref 11.5–15.5)
WBC: 6.7 10*3/uL (ref 4.0–10.5)
WBC: 5.8 10*3/uL (ref 4.0–10.5)

## 2010-06-02 LAB — URINE MICROSCOPIC-ADD ON

## 2010-06-02 LAB — POCT I-STAT, CHEM 8
BUN: 5 mg/dL — ABNORMAL LOW (ref 6–23)
Calcium, Ion: 1.03 mmol/L — ABNORMAL LOW (ref 1.12–1.32)
Calcium, Ion: 1.1 mmol/L — ABNORMAL LOW (ref 1.12–1.32)
Chloride: 104 mEq/L (ref 96–112)
Chloride: 106 mEq/L (ref 96–112)
Creatinine, Ser: 0.8 mg/dL (ref 0.4–1.2)
Glucose, Bld: 73 mg/dL (ref 70–99)
HCT: 35 % — ABNORMAL LOW (ref 36.0–46.0)
HCT: 36 % (ref 36.0–46.0)
Hemoglobin: 11.9 g/dL — ABNORMAL LOW (ref 12.0–15.0)
Hemoglobin: 12.2 g/dL (ref 12.0–15.0)
Potassium: 3.7 mEq/L (ref 3.5–5.1)
Sodium: 140 mEq/L (ref 135–145)
TCO2: 26 mmol/L (ref 0–100)
TCO2: 25 mmol/L (ref 0–100)

## 2010-06-02 LAB — RAPID URINE DRUG SCREEN, HOSP PERFORMED
Amphetamines: NOT DETECTED
Barbiturates: NOT DETECTED
Benzodiazepines: NOT DETECTED
Cocaine: NOT DETECTED
Opiates: NOT DETECTED

## 2010-06-02 LAB — WET PREP, GENITAL
Clue Cells Wet Prep HPF POC: NONE SEEN
Yeast Wet Prep HPF POC: NONE SEEN

## 2010-06-02 LAB — RETICULOCYTES
RBC.: 3.84 MIL/uL — ABNORMAL LOW (ref 3.87–5.11)
RBC.: 3.95 MIL/uL (ref 3.87–5.11)
Retic Count, Absolute: 38.4 10*3/uL (ref 19.0–186.0)
Retic Count, Absolute: 71.1 10*3/uL (ref 19.0–186.0)
Retic Ct Pct: 1 % (ref 0.4–3.1)

## 2010-06-30 NOTE — Consult Note (Signed)
[name], [name]              ACCOUNT NO.:  [id number]   MEDICAL RECORD NO.:  [id number]         PATIENT TYPE:  CINP   LOCATION:                               FACILITY:  [facility]   PHYSICIAN:  Sharlet Salina T. Hoxworth, M.D.DATE OF BIRTH:  10-Nov-1985   DATE OF CONSULTATION:  09/09/2007  DATE OF DISCHARGE:                                 CONSULTATION   CHIEF COMPLAINTS:  Abdominal pain.   HISTORY OF PRESENT ILLNESS:  I was asked by the Internal Medicine  Teaching Service to evaluate Ms. Bowermaster.  She is a 25 year old African-  American female with a  significant history of laparoscopic bilateral  tubal ligation in December 2008.  The following day, she was returned to  the operating room due to an evisceration through the wound, which was  closed.  She states that she has had intermittent pain at the umbilical  area ever since.  It occasionally has been severe.  It tends to radiate  out into her abdomen.  Today, she presented to the hospital with a  complaint of severe abdominal pain diffusely in her abdomen, also  radiated up into her chest associated with shortness of breath and some  nausea and vomiting, which was streaked with blood.  She was admitted to  the Teaching Service.  She gave a history of sickle cell disease, but  apparently on researching this, she has sickle cell trait rather than  disease.  The patient was admitted and I am now seeing her several hours  later.  At this point, she says she is feeling more comfortable.  She  feels clear that the pain originated around the center of her abdomen at  the umbilicus and radiated outward.  The pain is better now, but still  present around the umbilicus.  She is no longer having any shortness of  breath or nausea.  The pain seems to have been better with lying down  and resting.   PAST MEDICAL HISTORY:  Probable sickle cell trait as above.  The surgery  as above.  She is gravida 4, para 3, no other significant medical or  surgical history.   MEDICATIONS:  On admission, Percocet for pain.   ALLERGIES:  None.   SOCIAL HISTORY:  She lives with her boyfriend and children.  Denies  alcohol or drugs, positive cigarettes.   FAMILY HISTORY:  Noncontributory.   PHYSICAL EXAMINATION:  VITAL SIGNS:  She is afebrile.  Vital signs are  within normal limits.  GENERAL:  She is a well-developed young African-American female, in no  acute distress.  SKIN:  Warm and dry.  HEENT:  No mass or thyromegaly.  Sclerae nonicteric.  LUNGS:  Clear without wheezing or increased work of breathing.  CARDIAC:  Regular rate and rhythm.  No murmurs.  ABDOMEN:  Nondistended.  Her abdomen generally now is soft and  nontender.  There is, however, tenderness around the umbilicus where  there is a palpable hernia defect, which feels soft and for the most  part reducible.   LABORATORY DATA AND X-RAY:  White count was mildly elevated at 11.1.  Remainder of her laboratory was unremarkable.   CT scan of the abdomen and pelvis was reviewed.  This shows an abdominal  wall defect or incisional hernia at the umbilicus, which contains  preperitoneal fat or omentum.  There was no bowel involvement or bowel  obstruction.  There is increased stranding around the fat indicating  inflammatory change compared with previous CT scan.   ASSESSMENT AND PLAN:  Incisional umbilical hernia, which contains fat  which likely became incarcerated.  I think, this was the source of her  presentation.  She currently is feeling better.  There is no need for  emergency surgery tonight.  The patient is being evaluated for other  symptoms such as shortness of breath and questionable hematemesis.  I  think she should have her hernia repaired at this hospitalization.  We  will plan to do this within the next couple of days.  This was discussed  with the patient and her fiance.      Lorne Skeens. Hoxworth, M.D.  Electronically Signed     BTH/MEDQ  D:   09/09/2007  T:  09/09/2007  Job:  [id number]

## 2010-06-30 NOTE — H&P (Signed)
[name], [name]              ACCOUNT NO.:  [id number]   MEDICAL RECORD NO.:  [id number]          PATIENT TYPE:  INP   LOCATION:  1516                         FACILITY:  [facility]   PHYSICIAN:  Della Goo, M.D. DATE OF BIRTH:  [birth year]   DATE OF ADMISSION:  03/11/2008  DATE OF DISCHARGE:                              HISTORY & PHYSICAL   PRIMARY CARE PHYSICIAN:  [facility]   CHIEF COMPLAINT:  Right-sided flank pain.   HISTORY OF THE PRESENT ILLNESS:  This is a 25 year old female who  presents to the emergency department with complaints of severe right-  sided flank pain along with nausea and vomiting for the past 2 days.  She reports the pain has been worsening and states that she began to run  fevers and have  chills over the past 24 hours.  The patient reports  having decreased by mouth intake of foods and liquids secondary to her  symptoms.  She states that she has also had weakness and has had  difficulty moving.   PAST MEDICAL AND SURGICAL HISTORY:  The past medical/surgical history is  significant for:  1. Significant for sickle cell trait.  2. Status post bilateral tubal ligation.  3. Gravida 4, para 3.  4. Umbilical hernia repair.  5. Asthma.   MEDICATIONS:  The patient's medications at this time include albuterol 1-  2 puffs four times a day as needed.   ALLERGIES:  No known drug allergies.   SOCIAL HISTORY:  The patient is a nonsmoker and a nondrinker.   FAMILY HISTORY:  The family history is noncontributory.   REVIEW OF SYSTEMS:  On the review of systems pertinents are as mentioned  above.   PHYSICAL EXAMINATION:  GENERAL APPEARANCE:  The physical examination  findings reveal that this is a 25 year old thin, well-nourished and well-  developed female in discomfort, but no acute distress.  VITAL SIGNS:  Temperature is 102.1, blood pressure is 115/80, heart rate  is 105, respirations 18, and O2 saturation 98% on room air.  HEENT EXAMINATION:   Normocephalic and atraumatic.  Pupils are equally  round and react to light.  Extraocular movements are intact.  Funduscopic is benign.  Oropharynx is clear.  NECK:  The neck is supple with full range of motion.  No thyromegaly,  adenopathy or jugulovenous distention.  HEART:  Cardiovascular - mild tachycardia.  No murmurs, gallops or rubs.  LUNGS:  The lungs are clear to auscultation bilaterally.  ABDOMEN:  The abdomen reveals positive bowel sounds.  It is soft,  nontender and nondistended.  BACK:  The back examination reveals positive right-sided costovertebral  angle tenderness.  EXTREMITIES:  The extremities are without cyanosis, clubbing or edema.  NEUROLOGIC:  On neurological examination the patient is alert and  oriented x3.  Nonfocal.   LABORATORY STUDIES:  White blood cell count 15.5, hemoglobin 11.5,  hematocrit 34.7 and platelets 312,000 with neutrophils 86% and  lymphocytes 6%.  Sodium 135, potassium 3.8, chloride 104, bicarb 24, BUN  4, creatinine 0.81, and glucose 104.  Urinalysis is positive for  nitrites,  moderate leukocyte esterase and Trichomonas are  seen on the  urine microscopic.  Urine HCG is negative.   ASSESSMENT:  The patient is a 25 year old female being admitted with:  1. Sepsis.  2. Pyelonephritis.  3. Anemia.  4. Trichomonas vaginitis.   PLAN:  1. The patient has been admitted and has been placed on antibiotic      therapy of ciprofloxacin and Rocephin.  2. Blood cultures and urine cultures have been requested.  3. The patient was administered a single dose treatment for      Trichomonas with 2 grams of metronidazole.  4. Urine culture for gonorrhea and chlamydia has also been requested.  5. The patient will be placed on DVT and GI prophylaxes.  6. Further workup will ensue pending results of the patient's clinical      course and her studies.  7. Intravenous fluids have been ordered for fluid resuscitation.  8. Pain control and antiemetics have  also been ordered as needed.      Della Goo, M.D.  Electronically Signed     HJ/MEDQ  D:  03/12/2008  T:  03/13/2008  Job:  [id number]

## 2010-06-30 NOTE — Op Note (Signed)
NAMEMANUELITA, [name]              ACCOUNT NO.:  [id number]   MEDICAL RECORD NO.:  [id number]          PATIENT TYPE:  INP   LOCATION:  9127                          FACILITY:  [facility]   PHYSICIAN:  Phil D. Okey Dupre, M.D.     DATE OF BIRTH:  Feb 10, 1986   DATE OF PROCEDURE:  02/03/2007  DATE OF DISCHARGE:                               OPERATIVE REPORT   PROCEDURE:  Reduction of evisceration and reclosure of umbilical wound.   PREOPERATIVE DIAGNOSIS:  Postoperative evisceration, minimal, with  umbilical wound dehiscence.   POSTOPERATIVE DIAGNOSIS:  Postoperative evisceration, minimal, with  umbilical wound dehiscence.   SURGEON:  Javier Glazier. Okey Dupre, M.D.   FIRST ASSISTANT:  Dr. Melvenia Beam.   ANESTHESIA:  Spinal plus MAC.   ESTIMATED BLOOD LOSS:  Minimal.   PATHOLOGY SPECIMEN:  Zero.   POSTOPERATIVE CONDITION:  Satisfactory.   OPERATIVE FINDINGS:  When the dressing was taken off in the operating  room after the anesthesia was given, the patient was in dorsal supine  position.  The abdomen was then prepped and draped in the usual sterile  manner.  When prior to the preparation it was obvious that the portion  of bowel that was seen in the patient's room had reduced somewhat and  there was a tiny little 0.5-cm area that appeared to be bowel at the  umbilicus, the abdomen was prepped and draped in the usual sterile  manner and the edges of the incision were grasped carefully with Allis  clamps to be able to see the incision.  On holding up the skin edges of  the dehisced incision, we could see a small defect as the small piece of  bowel that had eviscerated fell back into the peritoneal cavity.  It was  pink and looked completely normal.  The small defect of approximately 1  cm at the upper pole of the peritoneal edge was found to be open.  The  peritoneal incision was then opened completely and digitally examined to  make sure that there was no further opening in the peritoneum or fascia.  Once  this was done the fascia, which was very thin and almost directly  under the skin and this level of the umbilicus, was carefully closed  with a continuous running locked 0 Vicryl suture on an atraumatic  needle.  The area was once again observed for any further defect and  none was noted, and the skin edges were approximated with Dermabond.  A  dry sterile dressing was applied and the patient was transferred to the  recovery room in satisfactory condition, having tolerated the procedure  well.     Phil D. Okey Dupre, M.D.  Electronically Signed    PDR/MEDQ  D:  02/03/2007  T:  02/04/2007  Job:  [id number]

## 2010-06-30 NOTE — Op Note (Signed)
[name], [name]              ACCOUNT NO.:  [id number]   MEDICAL RECORD NO.:  [id number]          PATIENT TYPE:  WOC   LOCATION:  WOC                          FACILITY:  [facility]   PHYSICIAN:  Allie Bossier, MD        DATE OF BIRTH:  [birth year]   DATE OF PROCEDURE:  DATE OF DISCHARGE:                               OPERATIVE REPORT   PREOPERATIVE DIAGNOSES:  1. Multiparity, desires sterility.  2. Sickle cell anemia.   POSTOPERATIVE DIAGNOSES:  1. Multiparity, desires sterility.  2. Sickle cell anemia.   PROCEDURE:  Postpartum tubal ligation with Filshie clips.   SURGEON:  M. Marice Potter, M.D., and Judie Petit. Sharol Harness, M.D.   ANESTHESIA:  Barbaraann Faster, M.D.   COMPLICATIONS:  None.   ESTIMATED BLOOD LOSS:  Minimal.   SPECIMENS:  None.   FINDINGS:  Normal oviducts.   DETAILED PROCEDURE AND FINDINGS:  The risks, benefits, and alternatives  of surgery were explained, understood and accepted.  [name] declines  alternative forms of birth control.  She is also certain she does not  want any more children.  She understands the 1% failure rate and is able  to verbalize this prior to surgery.  She was taken to the operating  room, and general anesthesia was applied without complication.  Her  abdomen was prepped and draped usual sterile fashion.  Her uterus was  located approximately 1 cm below the umbilicus.  An infraumbilical  incision was made, and an incision was carried down to the fascia.  The  fascia was elevated with Kocher clamps and incised with curved Mayo  scissors.  Peritoneum was entered with hemostats.  The oviduct on the  right was grasped with Babcock clamp and traced to the fimbriated end.  It was then retraced to the isthmic region where a Filshie clip was  placed perpendicular to and across the entire oviduct.  Approximately 1  mL of 0.5% Marcaine was injected into the tube for postoperative pain  relief.  A repeat procedure was performed on the left.  Again, the  fimbria  were visualized, and the clip was placed in the isthmic region.  No bleeding was noted at either site, even after the injection of  Marcaine on both tubes.  The oviducts were allowed to fall back in the  abdominal cavity, and the fascia was elevated with Allis clamps.  Fascia  was closed with a 0 Vicryl running nonlocking suture.  No defects were  palpable.  The subcutaneous tissue was infiltrated with approximately 10  mL of 0.5% Marcaine.  A subcuticular closure done with 4-0 Vicryl  suture.  Instrument, sponge and needle counts were correct.  She  tolerated the procedure well.  She was taken to recovery room in stable  condition.      Allie Bossier, MD  Electronically Signed     MCD/MEDQ  D:  02/02/2007  T:  02/02/2007  Job:  [id number]

## 2010-06-30 NOTE — Discharge Summary (Signed)
[name], [name]              ACCOUNT NO.:  [id number]   MEDICAL RECORD NO.:  [id number]          PATIENT TYPE:  INP   LOCATION:  1516                         FACILITY:  [facility]   PHYSICIAN:  Hillery Aldo, M.D.   DATE OF BIRTH:  06/04/1985   DATE OF ADMISSION:  03/11/2008  DATE OF DISCHARGE:  03/15/2008                               DISCHARGE SUMMARY   PRIMARY CARE PHYSICIAN:  Dineen Kid. Reche Dixon, M.D. at [facility].   DISCHARGE DIAGNOSES:  1. Escherichia coli pyelonephritis.  2. Normocytic anemia.  3. Asthma.  4. Nausea and vomiting.  5. Hypokalemia.  6. History of sickle cell trait per patient report.  7. Ruptured right ovarian follicle.  8. Pancreatic cysts with no evidence of pancreatitis.   DISCHARGE MEDICATIONS:  1. Cipro 500 mg b.i.d. x10 days.  2. Iron sulfate 325 mg b.i.d.  3. Phenergan 25 mg q.6 h p.r.n.  4. Vicodin 5/500 1 tablet q.6 h p.r.n. pain.   CONSULTATIONS:  None.   BRIEF ADMISSION HPI:  The patient is a 25 year old female who presented  to the hospital with the chief complaint of severe right-sided flank  pain accompanied by nausea and vomiting.  She also reported fever and  chills 24 hours prior to presentation.  Upon initial evaluation in the  emergency department, the patient was febrile with a temperature of  102.1 and had evidence of pyelonephritis and therefore was admitted for  further evaluation and treatment.  The full details, please see the  dictated report done by Dr. Lovell Sheehan.   PROCEDURES AND DIAGNOSTIC STUDIES:  CT scan of the abdomen and pelvis on  March 11, 2008 showed enlargement of the uncinate process of the  pancreas containing multiple small cysts.  Questionable pancreatitis.  There was a right ovarian follicle and free fluid in the right pelvis,  likely due to a ruptured ovarian follicle.   DISCHARGE LABORATORY VALUES:  Sodium was 139, potassium was 3.4 (given  60 mEq of oral potassium prior to discharge), chloride 107,  bicarb 26,  BUN less than 1, creatinine 0.66, glucose 88.  White blood cell count  was 7.8, hemoglobin 9.7, hematocrit 29.6, platelets 271.   HOSPITAL COURSE BY PROBLEM:  1. Right-sided flank pain, multifactorial:  The patient's right-sided      flank pain was felt to be due to pyelonephritis and a ruptured      right ovarian cyst.  Although there was evidence of new pancreatic      cysts noted on CT scanning, the patient's lipase and amylase levels      were not elevated to suggest acute pancreatitis.  Urine culture      subsequently grew greater than 100,000 colonies per ml of      Escherichia coli.  The patient has completed 4 days of therapy with      IV Maxipime and will be discharged on oral ciprofloxacin for an      additional 10 days of therapy.  Additionally, the patient did have      GC and Chlamydia tests done and both were negative.  2. Escherichia coli pyelonephritis:  Again, the patient will be      treated with an additional 10 days of therapy with Cipro.  3. Normocytic anemia:  The patient does have a normocytic anemia.  Her      ferritin was 59.  Iron less than 10.  These findings are consistent      with iron deficiency related to menstrual losses.  The patient was      put on iron supplementation therapy.  4. Nausea and vomiting:  The patient's nausea and vomiting were felt      to be due to pyelonephritis and a ruptured right ovarian cyst.  She      was provided with antibiotics as needed.  5. Hypokalemia:  The patient was repleted periodically during the      hospitalization and will be given an additional 60 mEq of oral      replacement therapy prior to discharge.  6. Sickle cell trait:  The patient reports having a history of sickle      cell trait.  Unclear if this was diagnosed presumptively based on      family history or if she has had a hemoglobin electrophoresis test.      Nevertheless, the patient did not have any issues referable to this      during the  course of her hospital stay.   DISPOSITION:  The patient is homeless and currently resides at the [facility].  We will obtain a social worker consult to obtain a 3-  day supply of her medications.  She will be referred to [facility] for  hospital follow-up.  She states that she can get the remainder of her  prescriptions through [facility].   Time spent coordinating care for discharge and discharge instructions  equals 35 minutes.      Hillery Aldo, M.D.  Electronically Signed     CR/MEDQ  D:  03/15/2008  T:  03/15/2008  Job:  [id number]   cc:   Dala Dock

## 2010-06-30 NOTE — Op Note (Signed)
NAMEEMMALEE, [name]              ACCOUNT NO.:  [id number]   MEDICAL RECORD NO.:  [id number]          PATIENT TYPE:  INP   LOCATION:  5155                         FACILITY:  [facility]   PHYSICIAN:  Velora Heckler, MD      DATE OF BIRTH:  Feb 25, 1985   DATE OF PROCEDURE:  09/11/2007  DATE OF DISCHARGE:                               OPERATIVE REPORT   PREOPERATIVE DIAGNOSIS:  Umbilical hernia, recurrent.   POSTOPERATIVE DIAGNOSIS:  Umbilical hernia, recurrent.   PROCEDURE:  Repair umbilical hernia with Prolene mesh.   SURGEON:  Velora Heckler, MD, FACS   ANESTHESIA:  General per Kaylyn Layer Michelle Piper, MD   ESTIMATED BLOOD LOSS:  Minimal.   PREPARATION:  Betadine.   COMPLICATIONS:  None.   INDICATIONS:  The patient is a 25 year old black female admitted on the  Medical Service with abdominal pain.  On examination, she was found to  have an umbilical hernia which was moderately tender.  General Surgery  was consulted and the patient is now prepared and brought to the  operating room.  The patient has had previous procedures through the  umbilicus including tubal ligation.  She has had three prior  pregnancies.   BODY OF REPORT:  Procedure was done in OR #70 at [facility].  The patient was brought to the operating room and placed in a  supine position on the operating room table.  Following administration  of general anesthesia, the patient was prepped and draped in usual  strict aseptic fashion.  After ascertaining that an adequate level of  anesthesia have been achieved, an incision was made transversely just  below the level of the umbilicus with a #10 blade.  Dissection was  carried into subcutaneous tissues and hemostasis obtained with the  electrocautery.  Dissection was carried down to the fascia of the  abdominal wall.  Fascial plane was developed all around the umbilicus.  Umbilical hernia sac was then opened.  It contains incarcerated omentum  which was  freed from the undersurface of the umbilicus and reduced back  within the peritoneal cavity.  The entire margin of the fascial defect  was dissected out.  Fascial defect was then closed with interrupted 0  Ethibond sutures.  Next, a sheath of Ethicon ULTRAPRO mesh was cut to  the appropriate dimensions measuring approximately 3 inches x 3 inches.  It was placed as an onlay on to the fascial plane and secured  circumferentially with interrupted 0 Ethibond sutures.  Umbilicus was  then re-affixed to the abdominal wall at the site of hernia repair with  an interrupted 0 Ethibond suture.  Local field block was placed in the  fascia and in the skin.  Subcutaneous tissues were closed with  interrupted  3-0 Vicryl sutures.  Skin was closed with a running 4-0 Monocryl  subcuticular suture.  Wound was washed and dried and Benzoin and Steri-  Strips were applied.  Sterile dressings were applied.  The patient was  awakened from anesthesia and brought to the recovery room in stable  condition.  The patient tolerated the  procedure well.      Velora Heckler, MD  Electronically Signed     TMG/MEDQ  D:  09/11/2007  T:  09/12/2007  Job:  [id number]   cc:   Madaline Guthrie, M.D.

## 2010-06-30 NOTE — Discharge Summary (Signed)
NAMEILLIANA, [name]              ACCOUNT NO.:  [id number]   MEDICAL RECORD NO.:  [id number]          PATIENT TYPE:  INP   LOCATION:  5155                         FACILITY:  [facility]   PHYSICIAN:  Madaline Guthrie, M.D.    DATE OF BIRTH:  Apr 02, 1985   DATE OF ADMISSION:  09/05/2007  DATE OF DISCHARGE:  09/12/2007                               DISCHARGE SUMMARY   DISCHARGE DIAGNOSES:  1. Umbilical hernia, status post repair with Prolene mesh.  2. Sickle cell trait.  3. Status post tubal ligation in December 2008.  4. Bacterial vaginosis.   DISCHARGE MEDICATIONS:  1. Flagyl 500 mg by mouth twice daily for 7 days.  2. Albuterol inhaler 1-2 puffs every 4 hours as needed for shortness      of breath.  3. Vicodin 5/325 as needed every 4-6 hours for pain.  4. MiraLax 17 g per dose by mouth once daily as needed for      constipation.   DISPOSITION AND FOLLOWUP:  [name] was discharged in hemodynamically  stable condition.  Her nausea and vomiting were resolved.  She was  instructed to avoid strenuous activity for 2 weeks post surgery.  Her  diagnosis of sickle cell trait (not disease) was confirmed.  She will  follow up with Dr. Gerrit Friends from Surgery in 2-3 weeks.   PROCEDURES PERFORMED:  1. Chest x-ray on September 09, 2007, no acute abnormalities.      Thoracolumbar scoliosis.  2. Abdominal x-ray on September 09, 2007, no acute abnormalities.  3. CT of the abdomen and pelvis on September 09, 2007, no acute findings.      Fat-containing umbilical hernia was present on the prior study, but      now there is marked stranding of fat within the hernia which could      be due to inflammation or infarction.  The abdomen was otherwise      negative.  No acute findings in the pelvis.   CONSULTATIONS:  General Surgery was consulted regarding the nature of  [name]'s umbilical hernia.  They agreed to repair the hernia.  This  procedure was performed on September 11, 2007, and the hernia was repaired  successfully.   ADMISSION HISTORY:  [name] is a 25 year old female with past  medical history of sickle cell trait and status post tubal ligation in  December 2008, which was complicated by evisceration of one of the  wounds.  The history is provided mostly by her boyfriend, Oneita Kras, as the  patient is sedated, somnolent, and a poor historian.  As per boyfriend,  her pain began yesterday morning.  It was an intense sharp pain that  covered the entire abdomen.  She was also vomiting and unable to keep  any food down that day.  The patient and boyfriend noted dark red clots  or streaks of blood in the vomitus.  There are no frank blood.  Uncertain whether these blood clots came from the lungs, however.  This  is similar, but more severe in nature to previous episodes where she has  had abdominal pain and vomiting.  The patient notes the pain is going  into the chest and that it hurts to breathe, but she does not feel  shortness of breath.  Lying down made the abdominal pain somewhat  better, but no complete relief.  Nothing makes it worse except  palpation.  Denies diarrhea, denies any alcohol or drug abuse.  Notes  last BM was approximately 1 week ago.  The patient was passing gas.  The  patient was largely unable to answer other pertinent questions, and  boyfriend is unable to provide the information as well.  The patient's  boyfriend do endorse an increased social stressors in life lately.   ADMISSION PHYSICAL EXAMINATION:  VITAL SIGNS:  Temperature 98.5, blood  pressure 122/78, pulse 74, respiratory rate 18 breaths per minute, O2  sat 99% on room air.  GENERAL:  [name] is a young African American female in no acute  distress.  She is somnolent and displays some psychomotor retardation.  HEENT:  Extraocular movements were intact.  Pupils equal, round, and  reactive to light.  Oropharynx is moist with white phlegm.  NECK:  No bruits and was supple.  RESPIRATORY:  Clear to  auscultation bilaterally.  LUNGS:  No unusual lung sounds.  CARDIOVASCULAR:  Regular rate and rhythm with no murmurs, rubs, or  gallops.  ABDOMEN:  Soft, but exquisitely tender to touch, guarding and rigidity  diffusely.  Positive bowel sounds, but unable to assess rebound or  Murphy sign.  EXTREMITIES:  No edema.  SKIN:  Warm and dry with small hypopigmentations on the abdomen and  across back.  NEUROLOGICAL:  She was moving all 4 extremities spontaneously.  Cranial  nerves II through XII were grossly intact.  Bilateral lower extremity  strength was 5.5.  She was alert and oriented x3.  Exam revealed  decreased concentration and Mini-Mental Status Exam could not be  performed secondary to somnolence.   ADMISSION LABS:  Sodium 139, potassium 3.1, chloride 105, bicarb 27, BUN  5, creatinine 0.82, glucose 110, AST 22, ALT 13, calcium 9, and  magnesium 1.6.  White blood cell count 11.1, hemoglobin 13.4, hematocrit  39.9, and platelets 435.  Smear revealed no sickling.  Lipase was 13.  Pregnancy test was negative.  Urinalysis revealed small amount of  leukocytes 7-10 with negative nitrites.  LDH 132.   HOSPITAL COURSE:  1. Umbilical hernia:  Initial workup revealed umbilical hernia in      which the tissue appeared acutely inflamed.  It did not be appear      incarcerated and did not seem to fully explain her exquisite      tenderness on the abdomen or her nausea and vomiting.  CT was not      concerning for obstruction, and the patient was passing gas at the      time of admission.  The patient was able to eat and drink normally      both before and after her surgical procedure and did not vomit      during her hospital course.  When Surgery was consulted, they      agreed to repair the hernia in hopes this was the cause of her      abdominal pain.  Surgery was completed successfully, and she was      discharged today after her surgery, which was typically done in a      one-day  outpatient setting.  There was no complications.  She is  prescribed Percocet to control the pain and advised to rest for the      first few days after returning home and avoid strenuous activity      for at least 2 weeks.  2. Sickle cell trait.  From prior protein phoresis of her hemoglobin,      her makeup consists of 37.9% hemoglobin S and 57.7% hemoglobin A.      This is indicative of sickle cell trait, not a disease.  Therefore,      [name] should not be experiencing pain crises or acute chest      syndrome secondary to her sickle cell trait.  Evidence shows this      is extremely rare occurrence with the trait.  She has reported      painful crises because of sickle cell disease in the past as      reasons for ED visits.  This has been worked up multiple times with      negative findings.  We explained to her the difference between      trait and disease.  No further workup is needed in relation to this      condition.  3. Status post tubal ligation in December 2008.  [name]      experienced extravasation of one of her port incisions one day      after her tubal ligation surgery in December 2008.  Her incisions      were well healed and hardly visible at the time of admission.      However, complications from the surgery, likely explained the onset      of her umbilical hernia.  4. Vaginal discharge, bacterial vaginosis.  [name] complained of      a foul smell, whitish discharge from her vagina during her stay.      Wet prep was performed but failed to grow anything.  She was      treated empirically with 7 days of Flagyl and advised to follow up      with her primary care Jakevious Hollister if this condition persisted.   DISCHARGE LABS:  Sodium of 140, potassium of 3.3, chloride of 109,  bicarb 25, BUN less than 1, creatinine of 0.59, glucose 94, calcium 8.2,  white blood cell count 6.4, hemoglobin 11.4, hematocrit 34.9, platelet  count 324.  GC and chlamydia probe  negative.  HIV antibody, nonreactive.   DISCHARGE VITAL SIGNS:  Temperature 98.2, pulse 77, respirations 16,  blood pressure 103/65, and oxygen saturation 97% on room air.   Dictated by Paulla Dolly, MS 4      Madaline Guthrie, M.D.  Electronically Signed     [place]/MEDQ  D:  09/14/2007  T:  09/15/2007  Job:  [id number]   cc:   Velora Heckler, MD

## 2010-07-01 ENCOUNTER — Emergency Department (HOSPITAL_COMMUNITY)
Admission: EM | Admit: 2010-07-01 | Discharge: 2010-07-01 | Disposition: A | Discharge status: Home / Self Care | Payer: Medicaid Other | Attending: Emergency Medicine | Admitting: Emergency Medicine

## 2010-07-01 DIAGNOSIS — Z79899 Other long term (current) drug therapy: Secondary | ICD-10-CM | POA: Insufficient documentation

## 2010-07-01 DIAGNOSIS — N390 Urinary tract infection, site not specified: Secondary | ICD-10-CM | POA: Insufficient documentation

## 2010-07-01 DIAGNOSIS — R209 Unspecified disturbances of skin sensation: Secondary | ICD-10-CM | POA: Insufficient documentation

## 2010-07-01 DIAGNOSIS — D571 Sickle-cell disease without crisis: Secondary | ICD-10-CM | POA: Insufficient documentation

## 2010-07-01 DIAGNOSIS — F319 Bipolar disorder, unspecified: Secondary | ICD-10-CM | POA: Insufficient documentation

## 2010-07-01 LAB — URINALYSIS, ROUTINE W REFLEX MICROSCOPIC
Hgb urine dipstick: NEGATIVE
Nitrite: POSITIVE — AB
Protein, ur: NEGATIVE mg/dL
Specific Gravity, Urine: 1.013 (ref 1.005–1.030)
Urobilinogen, UA: 0.2 mg/dL (ref 0.0–1.0)

## 2010-07-01 LAB — POCT I-STAT, CHEM 8
Chloride: 105 mEq/L (ref 96–112)
Creatinine, Ser: 0.9 mg/dL (ref 0.4–1.2)
Glucose, Bld: 92 mg/dL (ref 70–99)
HCT: 33 % — ABNORMAL LOW (ref 36.0–46.0)
Hemoglobin: 11.2 g/dL — ABNORMAL LOW (ref 12.0–15.0)
Potassium: 3.8 mEq/L (ref 3.5–5.1)
Sodium: 140 mEq/L (ref 135–145)

## 2010-07-01 LAB — URINE MICROSCOPIC-ADD ON

## 2010-07-03 NOTE — Discharge Summary (Signed)
[name], [name]              ACCOUNT NO.:  [id number]   MEDICAL RECORD NO.:  [id number]          PATIENT TYPE:  WOC   LOCATION:  WOC                          FACILITY:  [facility]   PHYSICIAN:  Dr. Maurice March               DATE OF BIRTH:  May 17, 1985   DATE OF ADMISSION:  05/13/2006  DATE OF DISCHARGE:  05/15/2006                               DISCHARGE SUMMARY   DISCHARGE DIAGNOSES:  1. Chest pain.  2. Sickle cell anemia.   DISCHARGE MEDICATIONS:  1. Omeprazole 20 mg daily.  2. Percocet 5/325 mg q.6 hours for pain for five more days as needed.  3. Colace 100 mg 2 times a day as needed.  4. Albuterol inhaler 1-2 puffs q.6 hours as needed for shortness of      breath.  5. Ibuprofen 200 mg tablets 1 or 2 q.6 hours for pain.   PROCEDURES PERFORMED:  None.   ADMITTING HISTORY AND PHYSICAL:  This is a 25 year old African-American  with past medical history of sickle cell and a prior admission because  of chest pain one week ago.  She started having shortness of breath  mainly with exertion with some chest pain but no fever, no chills and no  cough.  The shortness of breath increased slightly for one week.  She  came to the ED on Tuesday and was discharged on medication.  Today she  comes in retrosternal chest pain 5/5 with no radiation.  Deep breaths  make it worse.  Three days prior to admission, she started having  hemoptysis.  These were streaks of blood, it happened three times.  She  had no melena, no bleeding gums, no epistaxis, no history of sick  contacts, no history of upper respiratory infection.   PHYSICAL EXAMINATION:  Temperature 97.4.  Blood pressure 106/69.  Pulse  75.  Respirations 19.  She was saturating at 97% on room air.   LABORATORY DATA:  Sodium 140, potassium 2.8, chloride 108, bicarbonate  25, BUN 2, creatinine 0.6, glucose 96, anion gap 7, bilirubin 0.6,  alkaline phosphatase 65, AST 23, ALT 11, protein 6.0, albumin 3.0,  calcium 8.4.  D-dimer was 0.35.  White  blood cells 9.6, hemoglobin 10.8,  platelets 382.  RDW 16.6, MCV 83, ANC 6.2, reticulocyte count 1.2,  ethanol less than 5, UA was negative.  UDS was negative.  Chest x-ray  showed no acute pulmonary disease and her ABGs were pH 7.31, CO2 of  46.0, oxygen 76.   HOSPITAL COURSE:  1. Patient was admitted, cardiac enzymes were obtained and they were      negative.  D-dimer also obtained and it was negative.  An EKG was      obtained, also it showed normal sinus rhythm with no signs of ST      segment elevation or depression.  The patient was also given      medication for pain.  By the next day, the patient reports that the      pain medication had worked and she was no longer complaining of  chest pain.  At that time, she was also relating that her cough had      been decreased.  Patient was ambulating by the second day and      relayed that the chest pain was feeling better.  2. Sickle cell.  The records from [place] could not be obtained.  She      would need electrophoresis as an outpatient.  She was taking folate      at home.  This would have to be worked up and her records would      have to be looked up as an outpatient.  She remains stable      throughout the hospital stay, no drop in hemoglobin.  3. Hypokalemia.  She was admitted.  EKG was done.  There were no EKG      changes.  Patient was supplemented with potassium and by the next      day her hypokalemia was resolved.  This will be followed up as an      outpatient.   DISCHARGE LABS:  On the day of discharge, her white blood cell was 6.8,  her hemoglobin was 10.0, her platelets were 409, MCV 82.7.  Her sodium  was 149, potassium 3.9, chloride 108, bicarbonate 28, BUN 1, creatinine  0.7, glucose of 81, calcium 8.6.  Cardiac enzymes remain negative.   Vital signs:  Temperature 97.4  Blood pressure 103/61.  Pulse 59.  Respirations 20.  Oxygen saturation was 100% on room air.   DISPOSITION:  On the day of discharge, she  will be followed up with an  appointment with Dr. Maple Hudson.  She will call for an appointment.  Here,  her records will be obtained for documentation of her sickle cell  anemia.  Also will be worked up if there are no records.  Also a BMET  will be done to follow up on her potassium.  We will also evaluate her  constipation and chest pain.      Marinda Elk, M.D.  Electronically Signed     ______________________________  Dr. Maurice March    AF/MEDQ  D:  07/04/2006  T:  07/04/2006  Job:  [id number]   cc:   Dr. Maple Hudson

## 2010-07-03 NOTE — Discharge Summary (Signed)
[name], [name]              ACCOUNT NO.:  [id number]   MEDICAL RECORD NO.:  [id number]          PATIENT TYPE:  INP   LOCATION:  5526                         FACILITY:  [facility]   PHYSICIAN:  Manning Charity, MD     DATE OF BIRTH:  February 28, 1985   DATE OF ADMISSION:  03/14/2006  DATE OF DISCHARGE:  03/19/2006                               DISCHARGE SUMMARY   DISCHARGE DIAGNOSES:  1. Sickle cell disease with acute pain crisis.  2. Nausea, vomiting and abdominal pain, resolved.  3. Hypotension secondary to narcotics and antiemetics plus or minus      volume depletion secondary to vomiting, resolved.  4. Menorrhagia.  5. Chronic lower extremity pain.  6. Constipation.   DISCHARGE MEDICATIONS:  1. Colace 100 mg p.o. b.i.d. p.r.n.  2. Multivitamin one p.o. daily.  3. Omeprazole 40 mg p.o. b.i.d.  4. Percocet 5/325 mg one to two tablets p.o.q.6h. p.r.n.  5. Dulcolax 15 mg q.6h. p.o. p.r.n.   CONDITION AT DISCHARGE:  Stable and improved.  The patient was  instructed to follow up with Dr. Rufina [name] in the outpatient clinic  on March 29, 2006 at 2:30 p.m.  The patient was also instructed to  drink a lot of fluids.  The patient needs stat BMET on follow-up to the  outpatient clinic.   PROCEDURES:  1. Chest x-ray on April 14, 2006, shows no active cardiopulmonary      disease.  Thoracolumbar dextroscoliosis.  2. 2-D echo on March 14, 2006, shows overall left ventricular      systolic function was normal.  Left ventricular ejection fraction      was estimated at 55%.  No regional wall motion abnormalities.      Diastolic function parameters were normal.  3. A CT of the abdomen and chest with contrast revealed no pulmonary      embolus.  Cardiac enlargement and pulmonary arterial hypertension.      There were no acute findings in the abdomen; specifically, the      liver, gallbladder, adrenal glands, kidneys, spleen, pancreas,      stomach and visualized bowel were  unremarkable.  4. Abdominal x-ray done on March 17, 2006, showed a nonspecific,      nonobstructive bowel gas pattern.   CONSULTANTS:  None.   ADMISSION HISTORY AND PHYSICAL EXAMINATION:  Please see the chart for  full details.  In summary, is a 24 year old African American female with  a history of sickle cell disease diagnosed as a child, who presented  with progressively worsening chest pain since 2-3 days prior to  admission.  Her chest pain was described as being midline, sternal,  sharp pressure-like pain, that was associated with abdominal pain  greatest in the left lower quadrant.  She then developed nausea with  multiple episodes of vomiting, occasionally blood-streaked, and cough.  She has also had acute worsening of her right lower extremity bone  pain over this time as well.  She denies any recent illnesses or known  triggers.  She reports that this pain episode is similar to previous  episodes she has had  while living in [place].  She recently moved to  [place] in September 2007 and has family here.   PHYSICAL EXAMINATION:  VITAL SIGNS ON ADMISSION:  Her temperature was  97.5, blood pressure 100/58, pulse 51.  She was saturating 99% on room  air.  GENERAL:  Pertinent physical exam findings include the patient was in no  acute distress.  CHEST:  She had crackles at the left base of her lungs.  CARDIAC:  Heart was regular with no murmurs appreciated.  ABDOMEN:  Soft.  Bowel sounds were present.  She was tender in the left  lower quadrant.  She had bilateral costovertebral angle tenderness.  No  rebound or guarding was noted.  EXTREMITIES:  She had no edema.  Her calves were tender to palpation.  [name]' was positive bilaterally.  She had decreased range of motion in  the lower spine region and her right knee.   LABS ON ADMISSION:  Sodium 14, potassium 3.6, chloride 106, bicarb 25,  BUN 5, creatinine 0.8, and glucose was 81.  White blood cell count 8.2,   hemoglobin 11.5, hematocrit 35, platelets 409, ANC was 4.7, MCV 82.4.  Reticulocyte percent was 1.1.  UA showed moderate leukocyte esterase, 15  ketones, few bacteria.  Cardiac markers were negative x2 at that time.   HOSPITAL COURSE:  Problem 1.  SICKLE CELL DISEASE WITH ACUTE PAIN CRISIS:  The patient was  admitted to rule out acute chest syndrome, which was ruled out with the  fact that the patient had no infiltrate on chest x-ray, no fever, and  her CT scan was negative.  She had cardiac enzymes were cycled and were  negative.  She had no acute changes noted on her EKG.  She had a CT  angiogram of the chest which ruled out a PE, and she also had no  evidence of pneumonia.  She was given oxygen via nasal cannula.  She was  rehydrated with IV fluids and an ABG revealed a pH of 7.3, a pCO2 of 46  and a pO2 of 76 with a bicarb of 23.  Also, her WBC count was not  elevated.  Blood cultures were drawn which were negative x2 sets.  Urinary Legionella antigen was negative.  Urine culture was negative, as  was a respiratory culture.  In the absence of any evidence to support  acute chest syndrome, this diagnosis was called an acute pain crisis  episode secondary to her sickle cell disease.  As the patient improved  over the next couple of days with narcotic analgesia as well as IV  fluids, we did feel that it would be safe to discharge her home given  the normal studies that we had received by that time.  The patient was  feeling well on the day of discharge and was given a prescription for  Percocet to help control whatever residual pain she had.  Of note, her  Streptococcus pneumoniae urinary antigen was also negative.  The urinary  drug screen was positive for benzodiazepines and opiates.  Her urine  pregnancy test was negative.  Lipase was normal at 16.  Again, she was  just discharged in stable and improved condition and hospital follow-up was arranged for the patient prior to discharge  in our outpatient  clinic.   Problem 2.  NAUSEA, VOMITING AND ABDOMINAL PAIN:  The patient states  that classically whenever she gets one of her sickle cell pain crises,  it is associated with nausea,  vomiting and abdominal pain as well.  To  be complete we did check an abdominal CT scan, which was negative.  Lipase was normal.  Specifically, she had no gallstones noted on  abdominal CT scan.  She actually did not vomit until about the third day  of her admission, and at that time it was felt that her vomiting may  have been secondary to the narcotic pain medications she was receiving  causing her severe constipation.  After she was able to move her bowels,  her nausea and vomiting resolved.  She was rehydrated and was discharged  with no further episodes of nausea and vomiting.   Problem 3.  HYPOTENSION:  This was believed to be secondary to narcotic  pain medications as well as antiemetics that the patient received while  she was in the hospital.  She also had been vomiting recently and may  have been volume-depleted as well.  Orthostatics were obtained which did  support her diagnosis of volume depletion and once her volume was  repleted, orthostatics corrected.  She was able to tolerate Percocet on  the day of discharge and was no longer nauseated or vomiting.  Therefore, her hypotension should stay resolved.  A TSH was drawn and  was normal at 0.54, and a random cortisol was also drawn and was normal  at 23.2.   Problem 4.  MENORRHAGIA:  The patient states that she has bleeding per  vagina since March 01, 2006.  Her menstruation continued throughout  her hospitalization and was still present on the day of discharge.  This  patient certainly warrants an outpatient pelvic exam is this was not  able to be done as an inpatient.  She did have urine pregnancy test  which was negative.  Her hemoglobin was stable throughout this admission  and was 11.6 on the day of discharge.  A GC  and chlamydia probe was done  in her urine, both of which were negative.  She did have a CT of her  abdomen and pelvis, which did not reveal any masses to explain her  menorrhagia.   Problem 5.  CONSTIPATION:  This was thought to be secondary to the  narcotic pain medication that the patient was receiving while in the  hospital.  She was given Colace and Dulcolax to take at home while she  is taking the Percocet.  She was able to have a bowel movement prior to  discharge, which did resolve a lot of her nausea, vomiting and abdominal  pain.  Her stool was Hemoccult-negative.   DISCHARGE LABS AND VITAL SIGNS:  On the day prior to discharge her  sodium was 140, potassium 3.5, chloride 111, bicarb 24, glucose 90, BUN  less than 1, creatinine 0.7, calcium 8.2.  Magnesium 1.9.  Two days  prior to discharge her white blood cell count was 9.3, hemoglobin 11.6,  hematocrit 35, platelets 342.  Total bilirubin 0.6, alkaline phosphatase 62, AST 17, ALT 9, total protein 5.9, albumin 2.8, calcium 8.7.  Temperature was 98.3, pulse 84, blood pressure 106/73, and she was  saturating at 98% on room air.   PENDING LABS:  None.      Chauncey Reading, D.O.  Electronically Signed      Manning Charity, MD  Electronically Signed    [facility]/MEDQ  D:  05/10/2006  T:  05/10/2006  Job:  [id number]   cc:   [name], M.D.

## 2010-07-04 LAB — URINE CULTURE
Colony Count: >=100000
Culture  Setup Time: 201205160857

## 2010-08-08 ENCOUNTER — Emergency Department (HOSPITAL_COMMUNITY)
Admission: EM | Admit: 2010-08-08 | Discharge: 2010-08-08 | Disposition: A | Discharge status: Home / Self Care | Payer: Medicaid Other | Attending: Emergency Medicine | Admitting: Emergency Medicine

## 2010-08-08 DIAGNOSIS — M549 Dorsalgia, unspecified: Secondary | ICD-10-CM | POA: Insufficient documentation

## 2010-08-08 DIAGNOSIS — N390 Urinary tract infection, site not specified: Secondary | ICD-10-CM | POA: Insufficient documentation

## 2010-08-08 DIAGNOSIS — J45909 Unspecified asthma, uncomplicated: Secondary | ICD-10-CM | POA: Insufficient documentation

## 2010-08-08 DIAGNOSIS — E119 Type 2 diabetes mellitus without complications: Secondary | ICD-10-CM | POA: Insufficient documentation

## 2010-08-08 LAB — URINALYSIS, ROUTINE W REFLEX MICROSCOPIC
Bilirubin Urine: NEGATIVE
Hgb urine dipstick: NEGATIVE
Ketones, ur: NEGATIVE mg/dL
Nitrite: NEGATIVE
Protein, ur: NEGATIVE mg/dL
Specific Gravity, Urine: 1.017 (ref 1.005–1.030)
Urobilinogen, UA: 0.2 mg/dL (ref 0.0–1.0)

## 2010-08-08 LAB — POCT PREGNANCY, URINE: Preg Test, Ur: NEGATIVE

## 2010-08-08 LAB — URINE MICROSCOPIC-ADD ON

## 2010-10-10 ENCOUNTER — Emergency Department (HOSPITAL_COMMUNITY)
Admission: EM | Admit: 2010-10-10 | Discharge: 2010-10-10 | Disposition: A | Discharge status: Home / Self Care | Payer: Medicaid Other | Attending: Emergency Medicine | Admitting: Emergency Medicine

## 2010-10-10 DIAGNOSIS — N898 Other specified noninflammatory disorders of vagina: Secondary | ICD-10-CM | POA: Insufficient documentation

## 2010-10-10 DIAGNOSIS — R109 Unspecified abdominal pain: Secondary | ICD-10-CM | POA: Insufficient documentation

## 2010-10-10 DIAGNOSIS — Z5900 Homelessness unspecified: Secondary | ICD-10-CM | POA: Insufficient documentation

## 2010-10-10 DIAGNOSIS — D573 Sickle-cell trait: Secondary | ICD-10-CM | POA: Insufficient documentation

## 2010-10-10 LAB — WET PREP, GENITAL
Clue Cells Wet Prep HPF POC: NONE SEEN
Trich, Wet Prep: NONE SEEN
Yeast Wet Prep HPF POC: NONE SEEN

## 2010-10-10 LAB — DIFFERENTIAL
Basophils Absolute: 0 10*3/uL (ref 0.0–0.1)
Lymphocytes Relative: 32 % (ref 12–46)
Monocytes Absolute: 0.7 10*3/uL (ref 0.1–1.0)
Neutro Abs: 5.1 10*3/uL (ref 1.7–7.7)

## 2010-10-10 LAB — COMPREHENSIVE METABOLIC PANEL
Alkaline Phosphatase: 69 U/L (ref 39–117)
BUN: 9 mg/dL (ref 6–23)
Chloride: 105 mEq/L (ref 96–112)
GFR calc Af Amer: >60 mL/min (ref >60)
GFR calc non Af Amer: >60 mL/min (ref >60)
Glucose, Bld: 83 mg/dL (ref 70–99)
Potassium: 3.3 mEq/L — ABNORMAL LOW (ref 3.5–5.1)
Total Bilirubin: 0.3 mg/dL (ref 0.3–1.2)

## 2010-10-10 LAB — URINALYSIS, ROUTINE W REFLEX MICROSCOPIC
Ketones, ur: NEGATIVE mg/dL
Nitrite: NEGATIVE
Protein, ur: NEGATIVE mg/dL

## 2010-10-10 LAB — CBC
HCT: 37.9 % (ref 36.0–46.0)
Hemoglobin: 12.9 g/dL (ref 12.0–15.0)
MCHC: 34 g/dL (ref 30.0–36.0)
WBC: 8.9 10*3/uL (ref 4.0–10.5)

## 2010-10-11 LAB — URINE CULTURE
Colony Count: NO GROWTH
Culture: NO GROWTH

## 2010-10-13 LAB — GC/CHLAMYDIA PROBE AMP, GENITAL
Chlamydia, DNA Probe: POSITIVE — AB
GC Probe Amp, Genital: NEGATIVE

## 2010-10-25 ENCOUNTER — Emergency Department (HOSPITAL_COMMUNITY)
Admission: EM | Admit: 2010-10-25 | Discharge: 2010-10-26 | Disposition: A | Discharge status: Home / Self Care | Payer: Medicaid Other | Attending: Emergency Medicine | Admitting: Emergency Medicine

## 2010-10-25 DIAGNOSIS — R109 Unspecified abdominal pain: Secondary | ICD-10-CM | POA: Insufficient documentation

## 2010-10-26 ENCOUNTER — Emergency Department (HOSPITAL_COMMUNITY)
Admission: EM | Admit: 2010-10-26 | Discharge: 2010-10-26 | Disposition: A | Discharge status: Home / Self Care | Payer: Medicaid Other | Attending: Emergency Medicine | Admitting: Emergency Medicine

## 2010-10-26 ENCOUNTER — Emergency Department (HOSPITAL_COMMUNITY): Payer: Medicaid Other

## 2010-10-26 ENCOUNTER — Encounter (HOSPITAL_COMMUNITY): Payer: Self-pay

## 2010-10-26 DIAGNOSIS — G40909 Epilepsy, unspecified, not intractable, without status epilepticus: Secondary | ICD-10-CM | POA: Insufficient documentation

## 2010-10-26 DIAGNOSIS — N390 Urinary tract infection, site not specified: Secondary | ICD-10-CM | POA: Insufficient documentation

## 2010-10-26 DIAGNOSIS — R112 Nausea with vomiting, unspecified: Secondary | ICD-10-CM | POA: Insufficient documentation

## 2010-10-26 DIAGNOSIS — N72 Inflammatory disease of cervix uteri: Secondary | ICD-10-CM | POA: Insufficient documentation

## 2010-10-26 DIAGNOSIS — R109 Unspecified abdominal pain: Secondary | ICD-10-CM | POA: Insufficient documentation

## 2010-10-26 DIAGNOSIS — E119 Type 2 diabetes mellitus without complications: Secondary | ICD-10-CM | POA: Insufficient documentation

## 2010-10-26 DIAGNOSIS — A599 Trichomoniasis, unspecified: Secondary | ICD-10-CM | POA: Insufficient documentation

## 2010-10-26 HISTORY — DX: Tubal ligation status: Z98.51

## 2010-10-26 HISTORY — DX: Scoliosis, unspecified: M41.9

## 2010-10-26 HISTORY — DX: Sickle-cell trait: D57.3

## 2010-10-26 LAB — POCT PREGNANCY, URINE: Preg Test, Ur: NEGATIVE

## 2010-10-26 LAB — URINALYSIS, ROUTINE W REFLEX MICROSCOPIC
Bilirubin Urine: NEGATIVE
Glucose, UA: NEGATIVE mg/dL
Nitrite: NEGATIVE
Specific Gravity, Urine: 1.014 (ref 1.005–1.030)
Specific Gravity, Urine: 1.019 (ref 1.005–1.030)
Urobilinogen, UA: 0.2 mg/dL (ref 0.0–1.0)
Urobilinogen, UA: 0.2 mg/dL (ref 0.0–1.0)
pH: 6.5 (ref 5.0–8.0)

## 2010-10-26 LAB — CBC
MCV: 84 fL (ref 78.0–100.0)
Platelets: 290 10*3/uL (ref 150–400)
RBC: 4.5 MIL/uL (ref 3.87–5.11)
RDW: 14.9 % (ref 11.5–15.5)
WBC: 10.3 10*3/uL (ref 4.0–10.5)

## 2010-10-26 LAB — DIFFERENTIAL
Basophils Absolute: 0 10*3/uL (ref 0.0–0.1)
Basophils Relative: 0 % (ref 0–1)
Eosinophils Absolute: 0.1 10*3/uL (ref 0.0–0.7)
Lymphs Abs: 2.1 10*3/uL (ref 0.7–4.0)
Neutrophils Relative %: 71 % (ref 43–77)

## 2010-10-26 LAB — URINE MICROSCOPIC-ADD ON

## 2010-10-26 LAB — BASIC METABOLIC PANEL
Chloride: 104 mEq/L (ref 96–112)
GFR calc Af Amer: >60 mL/min (ref >60)
GFR calc non Af Amer: >60 mL/min (ref >60)
Potassium: 4.3 mEq/L (ref 3.5–5.1)
Sodium: 141 mEq/L (ref 135–145)

## 2010-10-26 LAB — GLUCOSE, CAPILLARY: Glucose-Capillary: 85 mg/dL (ref 70–99)

## 2010-10-27 LAB — URINE CULTURE
Colony Count: >=100000
Culture  Setup Time: 201209101211

## 2010-11-05 LAB — CULTURE, ROUTINE-ABSCESS

## 2010-11-13 LAB — URINE DRUGS OF ABUSE SCREEN W ALC, ROUTINE (REF LAB)
Amphetamine Screen, Ur: NEGATIVE
Benzodiazepines.: NEGATIVE
Cocaine Metabolites: NEGATIVE
Ethyl Alcohol: <10
Marijuana Metabolite: NEGATIVE
Opiate Screen, Urine: POSITIVE — AB

## 2010-11-13 LAB — DIFFERENTIAL
Basophils Absolute: 0.1
Eosinophils Absolute: 0.3
Eosinophils Absolute: 0.3
Eosinophils Relative: 3
Eosinophils Relative: 3
Lymphs Abs: 2.8
Lymphs Abs: 2.9
Monocytes Relative: 9

## 2010-11-13 LAB — BASIC METABOLIC PANEL
BUN: 5 — ABNORMAL LOW
BUN: <1 — ABNORMAL LOW
CO2: 25
Calcium: 8.2 — ABNORMAL LOW
Chloride: 105
Creatinine, Ser: 0.59
GFR calc non Af Amer: >60
Glucose, Bld: 110 — ABNORMAL HIGH
Glucose, Bld: 94
Potassium: 3.1 — ABNORMAL LOW
Sodium: 140

## 2010-11-13 LAB — CBC
HCT: 33.7 — ABNORMAL LOW
HCT: 39.9
HCT: 35.7 — ABNORMAL LOW
Hemoglobin: 11.1 — ABNORMAL LOW
MCHC: 32.6
MCHC: 32.8
MCV: 84.9
MCV: 85.9
Platelets: 324
Platelets: 342
Platelets: 435 — ABNORMAL HIGH
RBC: 4.16
RDW: 14.9
RDW: 15.8 — ABNORMAL HIGH
WBC: 11.1 — ABNORMAL HIGH
WBC: 6.2
WBC: 9.3

## 2010-11-13 LAB — COMPREHENSIVE METABOLIC PANEL
ALT: 15
ALT: 16
AST: 23
Albumin: 2.8 — ABNORMAL LOW
Alkaline Phosphatase: 66
BUN: <1 — ABNORMAL LOW
CO2: 27
Chloride: 111
Chloride: 106
GFR calc Af Amer: >60
GFR calc non Af Amer: >60
Potassium: 3.3 — ABNORMAL LOW
Sodium: 140
Sodium: 139
Total Bilirubin: 0.6
Total Bilirubin: 0.5
Total Protein: 5.6 — ABNORMAL LOW

## 2010-11-13 LAB — HEMOGLOBINOPATHY EVALUATION
Hemoglobin Other: 0 (ref 0.0–0.0)
Hgb A2 Quant: 3.2

## 2010-11-13 LAB — URINALYSIS, ROUTINE W REFLEX MICROSCOPIC
Nitrite: NEGATIVE
Specific Gravity, Urine: 1.022
Urobilinogen, UA: 1
pH: 6

## 2010-11-13 LAB — OPIATE, QUANTITATIVE, URINE
Hydrocodone: NEGATIVE ng/mL
Oxycodone, ur: NEGATIVE ng/mL

## 2010-11-13 LAB — OSMOLALITY: Osmolality: 285

## 2010-11-13 LAB — PORPHYRINS, FRACTIONATED URINE (TIMED COLLECTION)
Heptacarboxyl Porphyrin, 24H Ur: 0 umol/mol CRT (ref 0–2)
Porphyrins Interpretation: NORMAL
Uroporphyrin, Urine: 1 umol/mol CRT (ref 0–4)

## 2010-11-13 LAB — AST: AST: 22

## 2010-11-13 LAB — CULTURE, BLOOD (ROUTINE X 2): Culture: NO GROWTH

## 2010-11-13 LAB — POCT PREGNANCY, URINE
Operator id: 133351
Preg Test, Ur: NEGATIVE

## 2010-11-13 LAB — URINE MICROSCOPIC-ADD ON

## 2010-11-13 LAB — NA AND K (SODIUM & POTASSIUM), RAND UR: Sodium, Ur: 27

## 2010-11-13 LAB — LIPASE, BLOOD: Lipase: 13

## 2010-11-13 LAB — GC/CHLAMYDIA PROBE AMP, URINE: GC Probe Amp, Urine: NEGATIVE

## 2010-11-13 LAB — HIV ANTIBODY (ROUTINE TESTING W REFLEX): HIV: NONREACTIVE

## 2010-11-13 LAB — MAGNESIUM: Magnesium: 1.6

## 2010-11-13 LAB — TECHNOLOGIST SMEAR REVIEW

## 2010-11-16 LAB — URINE CULTURE: Colony Count: 75000

## 2010-11-16 LAB — RETICULOCYTES
RBC.: 4.37
Retic Ct Pct: 1.3

## 2010-11-16 LAB — URINE MICROSCOPIC-ADD ON

## 2010-11-16 LAB — DIFFERENTIAL
Basophils Absolute: 0.1
Basophils Relative: 1
Eosinophils Absolute: 0.1
Lymphs Abs: 1.8
Neutrophils Relative %: 75

## 2010-11-16 LAB — URINALYSIS, ROUTINE W REFLEX MICROSCOPIC
Bilirubin Urine: NEGATIVE
Hgb urine dipstick: NEGATIVE
Ketones, ur: NEGATIVE
Protein, ur: NEGATIVE
Urobilinogen, UA: 1

## 2010-11-16 LAB — TYPE AND SCREEN
ABO/RH(D): O POS
Antibody Screen: NEGATIVE

## 2010-11-16 LAB — CBC
MCV: 83.8
Platelets: 312
RDW: 16.6 — ABNORMAL HIGH
WBC: 11.1 — ABNORMAL HIGH

## 2010-11-16 LAB — GC/CHLAMYDIA PROBE AMP, GENITAL
Chlamydia, DNA Probe: NEGATIVE
GC Probe Amp, Genital: NEGATIVE

## 2010-11-16 LAB — WET PREP, GENITAL: WBC, Wet Prep HPF POC: NONE SEEN

## 2010-11-20 LAB — URINE MICROSCOPIC-ADD ON

## 2010-11-20 LAB — POCT URINALYSIS DIP (DEVICE)
Glucose, UA: NEGATIVE
Nitrite: NEGATIVE
Operator id: 194561
Specific Gravity, Urine: 1.015
Urobilinogen, UA: 1

## 2010-11-20 LAB — CBC
HCT: 32.8 — ABNORMAL LOW
Hemoglobin: 11.2 — ABNORMAL LOW
MCHC: 32.2 g/dL (ref 30.0–36.0)
MCV: 84.1 fL (ref 78.0–100.0)
RBC: 4.02
WBC: 11.1 10*3/uL — ABNORMAL HIGH (ref 4.0–10.5)
WBC: 12.6 — ABNORMAL HIGH
WBC: 13.2 — ABNORMAL HIGH

## 2010-11-20 LAB — URINALYSIS, ROUTINE W REFLEX MICROSCOPIC
Glucose, UA: NEGATIVE mg/dL
Ketones, ur: 15 mg/dL — AB
Protein, ur: NEGATIVE mg/dL
pH: 6 (ref 5.0–8.0)

## 2010-11-20 LAB — WET PREP, GENITAL

## 2010-11-20 LAB — GC/CHLAMYDIA PROBE AMP, GENITAL: GC Probe Amp, Genital: NEGATIVE

## 2010-11-20 LAB — RPR: RPR Ser Ql: NONREACTIVE

## 2010-11-23 LAB — CBC
MCHC: 33.6
MCV: 80.3
MCV: 81.4
Platelets: 296
RBC: 3.86 — ABNORMAL LOW
WBC: 10.9 — ABNORMAL HIGH

## 2010-11-23 LAB — URINALYSIS, ROUTINE W REFLEX MICROSCOPIC
Bilirubin Urine: NEGATIVE
Hgb urine dipstick: NEGATIVE
Nitrite: NEGATIVE
Specific Gravity, Urine: 1.01
pH: 6.5

## 2010-11-23 LAB — RPR: RPR Ser Ql: NONREACTIVE

## 2010-11-23 LAB — URINE MICROSCOPIC-ADD ON

## 2010-11-24 ENCOUNTER — Emergency Department (HOSPITAL_COMMUNITY)
Admission: EM | Admit: 2010-11-24 | Discharge: 2010-11-24 | Disposition: A | Discharge status: Home / Self Care | Payer: Medicaid Other | Attending: Emergency Medicine | Admitting: Emergency Medicine

## 2010-11-24 DIAGNOSIS — F17200 Nicotine dependence, unspecified, uncomplicated: Secondary | ICD-10-CM | POA: Insufficient documentation

## 2010-11-24 DIAGNOSIS — R112 Nausea with vomiting, unspecified: Secondary | ICD-10-CM | POA: Insufficient documentation

## 2010-11-24 DIAGNOSIS — R197 Diarrhea, unspecified: Secondary | ICD-10-CM | POA: Insufficient documentation

## 2010-11-24 DIAGNOSIS — D573 Sickle-cell trait: Secondary | ICD-10-CM | POA: Insufficient documentation

## 2010-11-24 DIAGNOSIS — G40909 Epilepsy, unspecified, not intractable, without status epilepticus: Secondary | ICD-10-CM | POA: Insufficient documentation

## 2010-11-24 LAB — CBC
MCH: 29.8 pg (ref 26.0–34.0)
MCHC: 33.8
Platelets: 309
Platelets: 272 10*3/uL (ref 150–400)
RBC: 3.9
RBC: 4.33 MIL/uL (ref 3.87–5.11)
RDW: 19.3 — ABNORMAL HIGH
WBC: 6.6 10*3/uL (ref 4.0–10.5)

## 2010-11-24 LAB — URINALYSIS, ROUTINE W REFLEX MICROSCOPIC
Bilirubin Urine: NEGATIVE
Ketones, ur: NEGATIVE
Ketones, ur: NEGATIVE mg/dL
Nitrite: NEGATIVE
Nitrite: NEGATIVE
Protein, ur: NEGATIVE mg/dL
Specific Gravity, Urine: 1.02
Urobilinogen, UA: 0.2
pH: 7.5 (ref 5.0–8.0)

## 2010-11-24 LAB — BASIC METABOLIC PANEL
Calcium: 8.8 mg/dL (ref 8.4–10.5)
Chloride: 106 mEq/L (ref 96–112)
Creatinine, Ser: 0.63 mg/dL (ref 0.50–1.10)
GFR calc Af Amer: >90 mL/min (ref >90)
Sodium: 139 mEq/L (ref 135–145)

## 2010-11-24 LAB — DIFFERENTIAL
Basophils Absolute: 0
Basophils Absolute: 0 10*3/uL (ref 0.0–0.1)
Basophils Relative: 0
Eosinophils Relative: 2 % (ref 0–5)
Lymphocytes Relative: 46 % (ref 12–46)
Lymphs Abs: 3 10*3/uL (ref 0.7–4.0)
Monocytes Absolute: 0.5 10*3/uL (ref 0.1–1.0)
Neutro Abs: 9 — ABNORMAL HIGH
Neutrophils Relative %: 76

## 2010-11-24 LAB — POCT PREGNANCY, URINE: Preg Test, Ur: NEGATIVE

## 2010-11-24 LAB — WET PREP, GENITAL
Trich, Wet Prep: NONE SEEN
Yeast Wet Prep HPF POC: NONE SEEN

## 2010-11-24 LAB — LACTATE DEHYDROGENASE: LDH: 117

## 2010-11-25 LAB — URINALYSIS, ROUTINE W REFLEX MICROSCOPIC
Bilirubin Urine: NEGATIVE
Hgb urine dipstick: NEGATIVE
Specific Gravity, Urine: 1.015
pH: 8

## 2010-11-25 LAB — URINE MICROSCOPIC-ADD ON

## 2010-11-25 LAB — STREP B DNA PROBE: Strep Group B Ag: POSITIVE

## 2010-11-25 LAB — GC/CHLAMYDIA PROBE AMP, GENITAL
Chlamydia, DNA Probe: NEGATIVE
GC Probe Amp, Genital: NEGATIVE

## 2010-11-26 LAB — STREP B DNA PROBE: Strep Group B Ag: POSITIVE

## 2010-11-26 LAB — CBC
HCT: 31.3 — ABNORMAL LOW
Hemoglobin: 10.6 — ABNORMAL LOW
MCHC: 33.8
MCV: 80.4
Platelets: 296
RDW: 18.4 — ABNORMAL HIGH
WBC: 11.6 — ABNORMAL HIGH

## 2010-11-26 LAB — URINALYSIS, ROUTINE W REFLEX MICROSCOPIC
Bilirubin Urine: NEGATIVE
Ketones, ur: NEGATIVE
Nitrite: NEGATIVE
pH: 7

## 2010-11-26 LAB — POCT URINALYSIS DIP (DEVICE)
Ketones, ur: NEGATIVE
Nitrite: NEGATIVE
Protein, ur: NEGATIVE
pH: 7

## 2010-11-26 LAB — GC/CHLAMYDIA PROBE AMP, GENITAL: Chlamydia, DNA Probe: NEGATIVE

## 2010-11-27 LAB — URINALYSIS, ROUTINE W REFLEX MICROSCOPIC
Nitrite: NEGATIVE
Specific Gravity, Urine: 1.014
Urobilinogen, UA: 0.2
pH: 7

## 2010-11-27 LAB — CROSSMATCH
ABO/RH(D): O POS
Antibody Screen: NEGATIVE

## 2010-11-27 LAB — HEPATIC FUNCTION PANEL
ALT: <8
Albumin: 2.5 — ABNORMAL LOW
Alkaline Phosphatase: 63
Total Bilirubin: 0.5
Total Protein: 6

## 2010-11-27 LAB — DIFFERENTIAL
Basophils Absolute: 0
Eosinophils Absolute: 0
Lymphocytes Relative: 19
Lymphs Abs: 1.7
Neutrophils Relative %: 74

## 2010-11-27 LAB — URINE CULTURE: Colony Count: >=100000

## 2010-11-27 LAB — URINE MICROSCOPIC-ADD ON

## 2010-11-27 LAB — BASIC METABOLIC PANEL
BUN: 3 — ABNORMAL LOW
Creatinine, Ser: 0.61
GFR calc non Af Amer: >60
Glucose, Bld: 79

## 2010-11-27 LAB — CBC
Hemoglobin: 9.1 — ABNORMAL LOW
MCV: 77.3 — ABNORMAL LOW
Platelets: 309
RBC: 3.46 — ABNORMAL LOW
RDW: 15.3 — ABNORMAL HIGH
WBC: 11.7 — ABNORMAL HIGH
WBC: 9

## 2010-11-27 LAB — RETICULOCYTES
RBC.: 3.41 — ABNORMAL LOW
Retic Ct Pct: 1.6

## 2010-11-27 LAB — ABO/RH: ABO/RH(D): O POS

## 2010-11-27 LAB — POCT CARDIAC MARKERS: Myoglobin, poc: 33.1

## 2010-11-30 LAB — POCT URINALYSIS DIP (DEVICE)
Bilirubin Urine: NEGATIVE
Glucose, UA: NEGATIVE
Glucose, UA: NEGATIVE
Hgb urine dipstick: NEGATIVE
Hgb urine dipstick: NEGATIVE
Nitrite: NEGATIVE
Nitrite: NEGATIVE
Urobilinogen, UA: 0.2
Urobilinogen, UA: 1
pH: 7

## 2010-12-01 ENCOUNTER — Emergency Department (HOSPITAL_COMMUNITY)
Admission: EM | Admit: 2010-12-01 | Discharge: 2010-12-01 | Disposition: A | Discharge status: Home / Self Care | Payer: Medicaid Other | Attending: Emergency Medicine | Admitting: Emergency Medicine

## 2010-12-01 DIAGNOSIS — Z5900 Homelessness unspecified: Secondary | ICD-10-CM | POA: Insufficient documentation

## 2010-12-01 DIAGNOSIS — R42 Dizziness and giddiness: Secondary | ICD-10-CM | POA: Insufficient documentation

## 2010-12-01 DIAGNOSIS — E119 Type 2 diabetes mellitus without complications: Secondary | ICD-10-CM | POA: Insufficient documentation

## 2010-12-01 DIAGNOSIS — R112 Nausea with vomiting, unspecified: Secondary | ICD-10-CM | POA: Insufficient documentation

## 2010-12-01 DIAGNOSIS — R5381 Other malaise: Secondary | ICD-10-CM | POA: Insufficient documentation

## 2010-12-01 DIAGNOSIS — D573 Sickle-cell trait: Secondary | ICD-10-CM | POA: Insufficient documentation

## 2010-12-01 DIAGNOSIS — G40909 Epilepsy, unspecified, not intractable, without status epilepticus: Secondary | ICD-10-CM | POA: Insufficient documentation

## 2010-12-01 LAB — URINALYSIS, ROUTINE W REFLEX MICROSCOPIC
Bilirubin Urine: NEGATIVE
Hgb urine dipstick: NEGATIVE
Nitrite: NEGATIVE
Protein, ur: NEGATIVE mg/dL
Urobilinogen, UA: 1 mg/dL (ref 0.0–1.0)

## 2010-12-01 LAB — POCT I-STAT, CHEM 8
Calcium, Ion: 1.13 mmol/L (ref 1.12–1.32)
Glucose, Bld: 83 mg/dL (ref 70–99)
HCT: 39 % (ref 36.0–46.0)
Hemoglobin: 13.3 g/dL (ref 12.0–15.0)
Potassium: 3.7 mEq/L (ref 3.5–5.1)

## 2010-12-01 LAB — POCT PREGNANCY, URINE: Preg Test, Ur: NEGATIVE

## 2010-12-02 LAB — POCT URINALYSIS DIP (DEVICE)
Bilirubin Urine: NEGATIVE
Glucose, UA: NEGATIVE
Ketones, ur: NEGATIVE
Operator id: 148111
Specific Gravity, Urine: 1.01

## 2010-12-03 LAB — WET PREP, GENITAL

## 2010-12-03 LAB — URINALYSIS, ROUTINE W REFLEX MICROSCOPIC
Bilirubin Urine: NEGATIVE
Nitrite: NEGATIVE
Specific Gravity, Urine: <1.005 — ABNORMAL LOW
Urobilinogen, UA: 0.2

## 2010-12-03 LAB — URINE CULTURE

## 2010-12-03 LAB — POCT PREGNANCY, URINE
Operator id: 114931
Preg Test, Ur: POSITIVE

## 2010-12-03 LAB — GC/CHLAMYDIA PROBE AMP, GENITAL: Chlamydia, DNA Probe: NEGATIVE

## 2010-12-03 LAB — URINE MICROSCOPIC-ADD ON

## 2010-12-26 ENCOUNTER — Encounter (HOSPITAL_COMMUNITY): Payer: Self-pay | Admitting: *Deleted

## 2010-12-26 ENCOUNTER — Emergency Department (HOSPITAL_COMMUNITY)
Admission: EM | Admit: 2010-12-26 | Discharge: 2010-12-27 | Disposition: A | Discharge status: Home / Self Care | Payer: Medicaid Other | Attending: Emergency Medicine | Admitting: Emergency Medicine

## 2010-12-26 DIAGNOSIS — A5901 Trichomonal vulvovaginitis: Secondary | ICD-10-CM | POA: Insufficient documentation

## 2010-12-26 DIAGNOSIS — R109 Unspecified abdominal pain: Secondary | ICD-10-CM | POA: Insufficient documentation

## 2010-12-26 DIAGNOSIS — F17200 Nicotine dependence, unspecified, uncomplicated: Secondary | ICD-10-CM | POA: Insufficient documentation

## 2010-12-26 DIAGNOSIS — Z79899 Other long term (current) drug therapy: Secondary | ICD-10-CM | POA: Insufficient documentation

## 2010-12-26 DIAGNOSIS — D573 Sickle-cell trait: Secondary | ICD-10-CM | POA: Insufficient documentation

## 2010-12-26 DIAGNOSIS — M545 Low back pain, unspecified: Secondary | ICD-10-CM | POA: Insufficient documentation

## 2010-12-26 DIAGNOSIS — N760 Acute vaginitis: Secondary | ICD-10-CM | POA: Insufficient documentation

## 2010-12-26 DIAGNOSIS — N72 Inflammatory disease of cervix uteri: Secondary | ICD-10-CM

## 2010-12-26 DIAGNOSIS — R112 Nausea with vomiting, unspecified: Secondary | ICD-10-CM | POA: Insufficient documentation

## 2010-12-26 NOTE — ED Notes (Signed)
Patient c/o of left side abdominal pain for the past few days.  States that it's hard to keep anything down, denies any diarrhea, with nausea and no vomiting

## 2010-12-27 LAB — CBC
HCT: 33.4 % — ABNORMAL LOW (ref 36.0–46.0)
Hemoglobin: 11.6 g/dL — ABNORMAL LOW (ref 12.0–15.0)
MCH: 29.5 pg (ref 26.0–34.0)
MCHC: 34.7 g/dL (ref 30.0–36.0)
MCV: 85 fL (ref 78.0–100.0)
Platelets: 274 10*3/uL (ref 150–400)
RBC: 3.93 MIL/uL (ref 3.87–5.11)
RDW: 14.3 % (ref 11.5–15.5)
WBC: 7.6 10*3/uL (ref 4.0–10.5)

## 2010-12-27 LAB — WET PREP, GENITAL: Yeast Wet Prep HPF POC: NONE SEEN

## 2010-12-27 LAB — URINALYSIS, ROUTINE W REFLEX MICROSCOPIC
Bilirubin Urine: NEGATIVE
Ketones, ur: NEGATIVE mg/dL
Nitrite: NEGATIVE
Urobilinogen, UA: 0.2 mg/dL (ref 0.0–1.0)

## 2010-12-27 LAB — BASIC METABOLIC PANEL
BUN: 8 mg/dL (ref 6–23)
CO2: 26 mEq/L (ref 19–32)
Calcium: 8.7 mg/dL (ref 8.4–10.5)
Chloride: 106 mEq/L (ref 96–112)
Creatinine, Ser: 0.73 mg/dL (ref 0.50–1.10)
GFR calc Af Amer: >90 mL/min (ref >90)
GFR calc non Af Amer: >90 mL/min (ref >90)
Glucose, Bld: 84 mg/dL (ref 70–99)
Potassium: 3.3 mEq/L — ABNORMAL LOW (ref 3.5–5.1)
Sodium: 140 mEq/L (ref 135–145)

## 2010-12-27 MED ORDER — HYDROCODONE-ACETAMINOPHEN 5-325 MG PO TABS
1.0000 | ORAL_TABLET | Freq: Once | ORAL | Status: AC
  Administered 2010-12-27: 1 via ORAL
  Filled 2010-12-27: qty 1

## 2010-12-27 MED ORDER — ONDANSETRON 4 MG PO TBDP
8.0000 mg | ORAL_TABLET | Freq: Once | ORAL | Status: AC
  Administered 2010-12-27: 8 mg via ORAL
  Filled 2010-12-27: qty 2

## 2010-12-27 MED ORDER — HYDROCODONE-ACETAMINOPHEN 7.5-325 MG/15ML PO SOLN: 15.0000 mL | Freq: Four times a day (QID) | ORAL | Status: AC | PRN

## 2010-12-27 MED ORDER — METRONIDAZOLE 500 MG PO TABS
2000.0000 mg | ORAL_TABLET | Freq: Once | ORAL | Status: AC
  Administered 2010-12-27: 2000 mg via ORAL
  Filled 2010-12-27: qty 4

## 2010-12-27 MED ORDER — CEFTRIAXONE SODIUM 250 MG IJ SOLR
250.0000 mg | Freq: Once | INTRAMUSCULAR | Status: AC
  Administered 2010-12-27: 250 mg via INTRAMUSCULAR
  Filled 2010-12-27: qty 250

## 2010-12-27 MED ORDER — AZITHROMYCIN 1 G PO PACK
1.0000 g | PACK | Freq: Once | ORAL | Status: AC
  Administered 2010-12-27: 1 g via ORAL
  Filled 2010-12-27: qty 1

## 2010-12-27 NOTE — ED Notes (Signed)
Pt was asleep when I checked on her.  No distress noted

## 2010-12-27 NOTE — ED Provider Notes (Signed)
History     CSN: [id number] Arrival date & time: 12/26/2010  9:21 PM   First MD Initiated Contact with Patient 12/27/10 0007      Chief Complaint  Patient presents with  . Abdominal Pain    HPI: Patient is a 25 y.o. female presenting with abdominal pain. The history is provided by the patient. No language interpreter was used.  Abdominal Pain The primary symptoms of the illness include abdominal pain, nausea and vomiting. The primary symptoms of the illness do not include fever, diarrhea, dysuria, vaginal discharge or vaginal bleeding. The current episode started 2 days ago. The onset of the illness was gradual.  Reports 2 day hx of RLQ abd pain and (R) flank and lower back pain. Mild nausea w/ an episode of vomiting just prior to arrival  Past Medical History  Diagnosis Date  . Scoliosis   . Sickle cell trait   . Hx of tubal ligation     No past surgical history on file.  No family history on file.  History  Substance Use Topics  . Smoking status: Passive Smoker    Types: Cigarettes  . Smokeless tobacco: Not on file  . Alcohol Use: No    OB History    Grav Para Term Preterm Abortions TAB SAB Ect Mult Living                  Review of Systems  Constitutional: Negative.  Negative for fever.  HENT: Negative.   Eyes: Negative.   Respiratory: Negative.   Cardiovascular: Negative.   Gastrointestinal: Positive for nausea, vomiting and abdominal pain. Negative for diarrhea.  Genitourinary: Negative.  Negative for dysuria, vaginal bleeding and vaginal discharge.  Musculoskeletal: Negative.   Skin: Negative.   Neurological: Negative.   Hematological: Negative.   Psychiatric/Behavioral: Negative.     Allergies  Review of patient's allergies indicates no known allergies.  Home Medications   Current Outpatient Rx  Name Route Sig Dispense Refill  . METOCLOPRAMIDE HCL 10 MG PO TABS Oral Take 10 mg by mouth every 6 (six) hours as needed. For nausea     . OXYCODONE  HCL 80 MG PO TB12 Oral Take 80 mg by mouth every 12 (twelve) hours. For pain       BP 95/56  Pulse 58  Temp(Src) 97 F (36.1 C) (Oral)  Resp 18  SpO2 98%  LMP 12/10/2010  Physical Exam  Constitutional: She is oriented to person, place, and time. She appears well-developed and well-nourished.  HENT:  Head: Normocephalic and atraumatic.  Eyes: Conjunctivae are normal.  Neck: Normal range of motion.  Cardiovascular: Normal rate.   Pulmonary/Chest: Effort normal.  Genitourinary: Vagina normal. Pelvic exam was performed with patient supine.       + vag d/c (creamy white) Mild CMT Cervix friable No masses or significant TTP bil adnexa Uterus nml  Musculoskeletal: Normal range of motion.  Neurological: She is alert and oriented to person, place, and time.  Skin: Skin is warm and dry.  Psychiatric: She has a normal mood and affect.    ED Course  Procedures Some relief w/ pain med. No further vomiting. Findings discussed w/ pt. Will d/c home, pt agreeable w/ plan.  Labs Reviewed  CBC - Abnormal; Notable for the following:    Hemoglobin 11.6 (*)    HCT 33.4 (*)    All other components within normal limits  BASIC METABOLIC PANEL - Abnormal; Notable for the following:    Potassium 3.3 (*)  All other components within normal limits  WET PREP, GENITAL - Abnormal; Notable for the following:    Trich, Wet Prep FEW (*)    Clue Cells, Wet Prep FEW (*)    WBC, Wet Prep HPF POC FEW (*)    All other components within normal limits  URINALYSIS, ROUTINE W REFLEX MICROSCOPIC  POCT PREGNANCY, URINE  POCT PREGNANCY, URINE  POCT PREGNANCY, URINE  GC/CHLAMYDIA PROBE AMP, GENITAL   No results found.   No diagnosis found.    MDM  U/A, u-preg, cbc and B-met w/o acute findings. Pelvic exam and wet prep findings c/w cervicitis and Trich.        Leanne Chang, NP 12/27/10 [phone]

## 2010-12-27 NOTE — ED Provider Notes (Signed)
Medical screening examination/treatment/procedure(s) were performed by non-physician practitioner and as supervising physician I was immediately available for consultation/collaboration.   Vida Roller, MD 12/27/10 [phone]

## 2010-12-29 LAB — GC/CHLAMYDIA PROBE AMP, GENITAL
Chlamydia, DNA Probe: POSITIVE — AB
GC Probe Amp, Genital: NEGATIVE

## 2010-12-30 NOTE — ED Notes (Signed)
+   chlamydia Patient treated with Zithromax and rocephin, DHHS letter faxed.

## 2011-01-01 ENCOUNTER — Telehealth (HOSPITAL_COMMUNITY): Payer: Self-pay | Admitting: *Deleted

## 2011-01-30 ENCOUNTER — Other Ambulatory Visit (HOSPITAL_COMMUNITY): Payer: Self-pay | Admitting: Emergency Medicine

## 2011-07-18 ENCOUNTER — Emergency Department (HOSPITAL_COMMUNITY)
Admission: EM | Admit: 2011-07-18 | Discharge: 2011-07-19 | Disposition: A | Discharge status: Home / Self Care | Payer: Medicaid Other | Attending: Emergency Medicine | Admitting: Emergency Medicine

## 2011-07-18 ENCOUNTER — Encounter (HOSPITAL_COMMUNITY): Payer: Self-pay | Admitting: *Deleted

## 2011-07-18 DIAGNOSIS — F419 Anxiety disorder, unspecified: Secondary | ICD-10-CM

## 2011-07-18 DIAGNOSIS — R0602 Shortness of breath: Secondary | ICD-10-CM | POA: Insufficient documentation

## 2011-07-18 DIAGNOSIS — D573 Sickle-cell trait: Secondary | ICD-10-CM | POA: Insufficient documentation

## 2011-07-18 DIAGNOSIS — F411 Generalized anxiety disorder: Secondary | ICD-10-CM | POA: Insufficient documentation

## 2011-07-18 DIAGNOSIS — R079 Chest pain, unspecified: Secondary | ICD-10-CM | POA: Insufficient documentation

## 2011-07-18 NOTE — ED Notes (Signed)
[id number]<YQ> Expected date:<BR> Expected time:11:04 PM<BR> Means of arrival:<BR> Comments:<BR> 26yoF Pain all over, sickle cell pain

## 2011-07-18 NOTE — ED Notes (Signed)
Pt states she was getting ready to sleep w/ sudden onset of chest pain. Pt states she has taken none of her regular medication and does not recall what the name of the medication is. Pt states she has hx of anxiety, relates this pain to anxiety, has not taken her medication rx'd to treat anxiety. Pt states she was staying at boyfriend's house and they "put her out" without allowing her time to retrieve her medication. Pt states she has place to stay at a "friend's" house.

## 2011-07-19 ENCOUNTER — Encounter (HOSPITAL_COMMUNITY): Payer: Self-pay | Admitting: Pharmacy Technician

## 2011-07-19 ENCOUNTER — Emergency Department (HOSPITAL_COMMUNITY): Payer: Medicaid Other

## 2011-07-19 LAB — DIFFERENTIAL
Eosinophils Absolute: 0.1 10*3/uL (ref 0.0–0.7)
Lymphs Abs: 3.2 10*3/uL (ref 0.7–4.0)
Monocytes Relative: 9 % (ref 3–12)
Neutrophils Relative %: 56 % (ref 43–77)

## 2011-07-19 LAB — BASIC METABOLIC PANEL
BUN: 10 mg/dL (ref 6–23)
CO2: 24 mEq/L (ref 19–32)
GFR calc non Af Amer: >90 mL/min (ref >90)
Glucose, Bld: 89 mg/dL (ref 70–99)
Potassium: 3.3 mEq/L — ABNORMAL LOW (ref 3.5–5.1)

## 2011-07-19 LAB — CBC
Hemoglobin: 12 g/dL (ref 12.0–15.0)
MCH: 30.5 pg (ref 26.0–34.0)
MCV: 86.5 fL (ref 78.0–100.0)
RBC: 3.94 MIL/uL (ref 3.87–5.11)

## 2011-07-19 MED ORDER — ASPIRIN 81 MG PO CHEW
324.0000 mg | CHEWABLE_TABLET | Freq: Once | ORAL | Status: AC
  Administered 2011-07-19: 324 mg via ORAL
  Filled 2011-07-19: qty 4

## 2011-07-19 MED ORDER — LORAZEPAM 1 MG PO TABS
1.0000 mg | ORAL_TABLET | Freq: Once | ORAL | Status: AC
  Administered 2011-07-19: 1 mg via ORAL
  Filled 2011-07-19: qty 1

## 2011-07-19 MED ORDER — IBUPROFEN 800 MG PO TABS
800.0000 mg | ORAL_TABLET | Freq: Once | ORAL | Status: AC
  Administered 2011-07-19: 800 mg via ORAL
  Filled 2011-07-19: qty 1

## 2011-07-19 NOTE — Discharge Instructions (Signed)
It is important to get into contact with your boyfriend's family in the morning to get your medication. You may alternate between tylenol and ibuprofen as needed for pain. Return to ER for emergent changing or worsening of symptoms otherwise follow up with your primary care provider to readdress your anxiety management.   Anxiety and Panic Attacks Your caregiver has informed you that you are having an anxiety or panic attack. There may be many forms of this. Most of the time these attacks come suddenly and without warning. They come at any time of day, including periods of sleep, and at any time of life. They may be strong and unexplained. Although panic attacks are very scary, they are physically harmless. Sometimes the cause of your anxiety is not known. Anxiety is a protective mechanism of the body in its fight or flight mechanism. Most of these perceived danger situations are actually nonphysical situations (such as anxiety over losing a job). CAUSES  The causes of an anxiety or panic attack are many. Panic attacks may occur in otherwise healthy people given a certain set of circumstances. There may be a genetic cause for panic attacks. Some medications may also have anxiety as a side effect. SYMPTOMS  Some of the most common feelings are:  Intense terror.   Dizziness, feeling faint.   Hot and cold flashes.   Fear of going crazy.   Feelings that nothing is real.   Sweating.   Shaking.   Chest pain or a fast heartbeat (palpitations).   Smothering, choking sensations.   Feelings of impending doom and that death is near.   Tingling of extremities, this may be from over-breathing.   Altered reality (derealization).   Being detached from yourself (depersonalization).  Several symptoms can be present to make up anxiety or panic attacks. DIAGNOSIS  The evaluation by your caregiver will depend on the type of symptoms you are experiencing. The diagnosis of anxiety or panic attack is  made when no physical illness can be determined to be a cause of the symptoms. TREATMENT  Treatment to prevent anxiety and panic attacks may include:  Avoidance of circumstances that cause anxiety.   Reassurance and relaxation.   Regular exercise.   Relaxation therapies, such as yoga.   Psychotherapy with a psychiatrist or therapist.   Avoidance of caffeine, alcohol and illegal drugs.   Prescribed medication.  SEEK IMMEDIATE MEDICAL CARE IF:   You experience panic attack symptoms that are different than your usual symptoms.   You have any worsening or concerning symptoms.  Document Released: 02/01/2005 Document Revised: 01/21/2011 Document Reviewed: 06/05/2009 [facility] Patient Information 2012 [place], [place].

## 2011-07-19 NOTE — ED Provider Notes (Signed)
History     CSN: [id number]  Arrival date & time 07/18/11  2316   First MD Initiated Contact with Patient 07/19/11 0009      Chief Complaint  Patient presents with  . Chest Pain    (Consider location/radiation/quality/duration/timing/severity/associated sxs/prior treatment) HPI  Patient  presents to emergency department by EMS complaining of chest pain and anxiety. Patient states that she was getting ready for bed and had acute onset substernal chest pain that she states is chest pain that she normally gets with her anxiety. Patient states her chest is very tender to the touch. Patient states she had been staying with her boyfriend's family however they "kicked her out." And her medication is still at their home and she was unable to get in touch with her boyfriend to get her medication. Patient states pain was acute onset, persistent, and unchanging over the last 2 hours. Patient has taken nothing for symptoms prior to arrival. She denies suicidal homicidal ideation. She denies fevers, chills, cough, shortness of breath, nausea, vomiting, diaphoresis, hemoptysis, lower any pain or swelling. Pain is aggravated by touch aeration denies alleviating factors.   Past Medical History  Diagnosis Date  . Scoliosis   . Sickle cell trait   . Hx of tubal ligation     History reviewed. No pertinent past surgical history.  History reviewed. No pertinent family history.  History  Substance Use Topics  . Smoking status: Passive Smoker    Types: Cigarettes  . Smokeless tobacco: Not on file  . Alcohol Use: No    OB History    Grav Para Term Preterm Abortions TAB SAB Ect Mult Living                  Review of Systems  All other systems reviewed and are negative.    Allergies  Other  Home Medications   Current Outpatient Rx  Name Route Sig Dispense Refill  . METOCLOPRAMIDE HCL 10 MG PO TABS Oral Take 10 mg by mouth every 6 (six) hours as needed. For nausea     . OXYCONTIN PO Oral  Take 1 tablet by mouth every 6 (six) hours as needed. For pain      BP 100/61  Pulse 73  Temp(Src) 98.8 F (37.1 C) (Oral)  Resp 16  SpO2 100%  LMP 07/04/2011  Physical Exam  Nursing note and vitals reviewed. Constitutional: She is oriented to person, place, and time. She appears well-developed and well-nourished. No distress.  HENT:  Head: Normocephalic and atraumatic.  Eyes: Conjunctivae are normal.  Neck: Normal range of motion. Neck supple.  Cardiovascular: Normal rate, regular rhythm, normal heart sounds and intact distal pulses.  Exam reveals no gallop and no friction rub.   No murmur heard. Pulmonary/Chest: Effort normal and breath sounds normal. No respiratory distress. She has no wheezes. She has no rales. She exhibits tenderness.       Moderate TTP of mid chest but no skin changes, crepitous or rash.   Abdominal: Soft. Bowel sounds are normal. She exhibits no distension and no mass. There is no tenderness. There is no rebound and no guarding.  Musculoskeletal: Normal range of motion. She exhibits no edema and no tenderness.  Neurological: She is alert and oriented to person, place, and time.  Skin: Skin is warm and dry. No rash noted. She is not diaphoretic. No erythema.  Psychiatric: She has a normal mood and affect.    ED Course  Procedures (including critical care time)  PO ativan and PO ASA  PO motrin.   Date: 07/19/2011  Rate: 65  Rhythm: normal sinus rhythm  QRS Axis: normal  Intervals: normal  ST/T Wave abnormalities: normal  Conduction Disutrbances:none  Narrative Interpretation: non provocative, unchanged from Nov 24, 2010  Old EKG Reviewed: unchanged    Labs Reviewed  CBC - Abnormal; Notable for the following:    HCT 34.1 (*)    All other components within normal limits  BASIC METABOLIC PANEL - Abnormal; Notable for the following:    Potassium 3.3 (*)    All other components within normal limits  DIFFERENTIAL  POCT I-STAT TROPONIN I   Dg  Chest 2 View  07/19/2011  *RADIOLOGY REPORT*  Clinical Data: Chest pain and shortness of breath.  CHEST - 2 VIEW  Comparison: 08/29/2009.  Findings: The cardiac silhouette, mediastinal and hilar contours are within normal limits and stable. The lungs are clear.  No pleural effusions.  The bony thorax is intact.  Stable thoracolumbar scoliosis.  IMPRESSION: Normal chest x-ray.  Original Report Authenticated By: P. Loralie Champagne, M.D.     1. Chest pain   2. Anxiety       MDM  Low risk for ACS or PE and PERC negative two hours or constant reproducible CP with non-provocative EKG and negative troponin. Patient states she'll be able to get in touch with her boyfriend in the next few hours, this morning, and get her medication from his family's house.        [place], [place] 07/19/11 0301

## 2011-07-19 NOTE — ED Notes (Signed)
Pt reports being kicked out of residents and was unable to obtain daily medication.  C/o of generalized pain.  Pt presents in no acute distress- cardiac monitor, pulse ox intact

## 2011-07-19 NOTE — ED Notes (Signed)
Pt will wait in [facility] and continue to attempt to find ride home.

## 2011-07-19 NOTE — ED Notes (Signed)
1st attempt to obtain labs pt in x-ray °

## 2011-07-19 NOTE — ED Provider Notes (Signed)
Medical screening examination/treatment/procedure(s) were performed by non-physician practitioner and as supervising physician I was immediately available for consultation/collaboration. Devoria Albe, MD, FACEP   Ward Givens, MD 07/19/11 [phone]

## 2011-11-19 ENCOUNTER — Emergency Department (HOSPITAL_COMMUNITY)
Admission: EM | Admit: 2011-11-19 | Discharge: 2011-11-19 | Disposition: A | Discharge status: Home / Self Care | Payer: Medicaid Other | Attending: Emergency Medicine | Admitting: Emergency Medicine

## 2011-11-19 ENCOUNTER — Encounter (HOSPITAL_COMMUNITY): Payer: Self-pay

## 2011-11-19 DIAGNOSIS — Z0389 Encounter for observation for other suspected diseases and conditions ruled out: Secondary | ICD-10-CM | POA: Insufficient documentation

## 2011-11-19 NOTE — ED Notes (Signed)
Pt states she was cleaning the house with bleach and is now having pain on 5th finger on right hand. Skin appears reddened, no swelling. Skin is intact.

## 2011-12-27 ENCOUNTER — Emergency Department (HOSPITAL_COMMUNITY)
Admission: EM | Admit: 2011-12-27 | Discharge: 2011-12-27 | Disposition: A | Discharge status: Home / Self Care | Payer: Medicaid Other | Attending: Emergency Medicine | Admitting: Emergency Medicine

## 2011-12-27 ENCOUNTER — Encounter (HOSPITAL_COMMUNITY): Payer: Self-pay | Admitting: *Deleted

## 2011-12-27 DIAGNOSIS — Z3202 Encounter for pregnancy test, result negative: Secondary | ICD-10-CM | POA: Insufficient documentation

## 2011-12-27 DIAGNOSIS — E119 Type 2 diabetes mellitus without complications: Secondary | ICD-10-CM | POA: Insufficient documentation

## 2011-12-27 DIAGNOSIS — R350 Frequency of micturition: Secondary | ICD-10-CM | POA: Insufficient documentation

## 2011-12-27 DIAGNOSIS — Z9851 Tubal ligation status: Secondary | ICD-10-CM | POA: Insufficient documentation

## 2011-12-27 DIAGNOSIS — D573 Sickle-cell trait: Secondary | ICD-10-CM | POA: Insufficient documentation

## 2011-12-27 DIAGNOSIS — I509 Heart failure, unspecified: Secondary | ICD-10-CM | POA: Insufficient documentation

## 2011-12-27 DIAGNOSIS — M79606 Pain in leg, unspecified: Secondary | ICD-10-CM

## 2011-12-27 DIAGNOSIS — Z87448 Personal history of other diseases of urinary system: Secondary | ICD-10-CM | POA: Insufficient documentation

## 2011-12-27 DIAGNOSIS — R112 Nausea with vomiting, unspecified: Secondary | ICD-10-CM | POA: Insufficient documentation

## 2011-12-27 DIAGNOSIS — M4120 Other idiopathic scoliosis, site unspecified: Secondary | ICD-10-CM | POA: Insufficient documentation

## 2011-12-27 DIAGNOSIS — M25579 Pain in unspecified ankle and joints of unspecified foot: Secondary | ICD-10-CM | POA: Insufficient documentation

## 2011-12-27 DIAGNOSIS — Z79899 Other long term (current) drug therapy: Secondary | ICD-10-CM | POA: Insufficient documentation

## 2011-12-27 DIAGNOSIS — M7989 Other specified soft tissue disorders: Secondary | ICD-10-CM | POA: Insufficient documentation

## 2011-12-27 LAB — CBC WITH DIFFERENTIAL/PLATELET
Basophils Absolute: 0 10*3/uL (ref 0.0–0.1)
Basophils Relative: 0 % (ref 0–1)
Eosinophils Absolute: 0.1 10*3/uL (ref 0.0–0.7)
MCH: 29.6 pg (ref 26.0–34.0)
MCHC: 34.3 g/dL (ref 30.0–36.0)
Neutro Abs: 4.4 10*3/uL (ref 1.7–7.7)
Neutrophils Relative %: 56 % (ref 43–77)
Platelets: 282 10*3/uL (ref 150–400)
RDW: 14 % (ref 11.5–15.5)

## 2011-12-27 LAB — POCT I-STAT, CHEM 8
Creatinine, Ser: 0.9 mg/dL (ref 0.50–1.10)
HCT: 41 % (ref 36.0–46.0)
Hemoglobin: 13.9 g/dL (ref 12.0–15.0)
Potassium: 3.5 mEq/L (ref 3.5–5.1)
Sodium: 144 mEq/L (ref 135–145)
TCO2: 23 mmol/L (ref 0–100)

## 2011-12-27 LAB — URINALYSIS, ROUTINE W REFLEX MICROSCOPIC
Glucose, UA: NEGATIVE mg/dL
Ketones, ur: NEGATIVE mg/dL
Leukocytes, UA: NEGATIVE
Specific Gravity, Urine: 1.018 (ref 1.005–1.030)
pH: 6.5 (ref 5.0–8.0)

## 2011-12-27 LAB — POCT PREGNANCY, URINE: Preg Test, Ur: NEGATIVE

## 2011-12-27 MED ORDER — OXYCODONE-ACETAMINOPHEN 5-325 MG PO TABS
2.0000 | ORAL_TABLET | Freq: Once | ORAL | Status: AC
  Administered 2011-12-27: 2 via ORAL
  Filled 2011-12-27: qty 2

## 2011-12-27 MED ORDER — ALBUTEROL SULFATE HFA 108 (90 BASE) MCG/ACT IN AERS
1.0000 | INHALATION_SPRAY | Freq: Four times a day (QID) | RESPIRATORY_TRACT | Status: DC | PRN
Start: 2011-12-27 — End: 2012-02-19

## 2011-12-27 MED ORDER — SODIUM CHLORIDE 0.9 % IV BOLUS (SEPSIS)
1000.0000 mL | Freq: Once | INTRAVENOUS | Status: AC
  Administered 2011-12-27: 1000 mL via INTRAVENOUS

## 2011-12-27 MED ORDER — OXYCODONE-ACETAMINOPHEN 5-325 MG PO TABS: 1.0000 | ORAL_TABLET | Freq: Four times a day (QID) | ORAL | Status: DC | PRN

## 2011-12-27 MED ORDER — ONDANSETRON HCL 4 MG/2ML IJ SOLN
4.0000 mg | Freq: Once | INTRAMUSCULAR | Status: AC
  Administered 2011-12-27: 4 mg via INTRAVENOUS
  Filled 2011-12-27: qty 2

## 2011-12-27 NOTE — ED Notes (Signed)
Pt is here with right leg pain behind knee and foot swelling-pulse present.  Pt also wants a preg test because she has been sick in mornings for the last couple of days with intermittent vomiting.  No abdominal pain

## 2011-12-27 NOTE — ED Notes (Signed)
LMP: last week

## 2011-12-27 NOTE — ED Notes (Signed)
Stated sleeping all day, pain to back of right leg. Pt stated  She wants pregnant test because she think she pregnant.  Denies any painful urination.

## 2011-12-27 NOTE — ED Provider Notes (Signed)
History     CSN: [id number]  Arrival date & time 12/27/11  1723   First MD Initiated Contact with Patient 12/27/11 2014      Chief Complaint  Patient presents with  . Leg Pain    (Consider location/radiation/quality/duration/timing/severity/associated sxs/prior treatment) HPI Comments: African American with a history of sickle cell disease CHF diabetes hypertension multiple myeloma renal insufficiency & presents today with one week of intermittent nausea and vomiting without diarrhea, right posterior knee pain without injury Also a concern for a pregnancy test due to the nausea although she has had a tubal ligation and her last menses ended 1 week ago.  She states that she did not have the standard tubal ligation- only cut not burnt. She currently does not have a physician and  has not made any attempt to establish with a new physician,  Currently out of  her oxycodone running out 3 days ago   Patient is a 26 y.o. female presenting with leg pain. The history is provided by the patient.  Leg Pain  The incident occurred more than 1 week ago. There was no injury mechanism. The pain is present in the right knee. The pain is at a severity of 4/10. The pain is mild. The pain has been constant since onset. Pertinent negatives include no numbness and no inability to bear weight. She reports no foreign bodies present. The symptoms are aggravated by activity. She has tried nothing for the symptoms.    Past Medical History  Diagnosis Date  . Scoliosis   . Sickle cell trait   . Hx of tubal ligation     History reviewed. No pertinent past surgical history.  No family history on file.  History  Substance Use Topics  . Smoking status: Passive Smoke Exposure - Never Smoker    Types: Cigarettes  . Smokeless tobacco: Not on file  . Alcohol Use: No    OB History    Grav Para Term Preterm Abortions TAB SAB Ect Mult Living                  Review of Systems  Constitutional: Negative for  fever and chills.  HENT: Negative for rhinorrhea.   Respiratory: Negative for shortness of breath.   Cardiovascular: Positive for leg swelling. Negative for chest pain.  Gastrointestinal: Positive for nausea and vomiting. Negative for abdominal pain and diarrhea.  Genitourinary: Positive for frequency. Negative for dysuria, flank pain, vaginal bleeding and vaginal discharge.  Musculoskeletal: Negative for joint swelling.  Skin: Negative for wound.  Neurological: Negative for weakness and numbness.    Allergies  Other  Home Medications   Current Outpatient Rx  Name  Route  Sig  Dispense  Refill  . ACETAMINOPHEN 500 MG PO TABS   Oral   Take 1,000 mg by mouth every 6 (six) hours as needed. For pain.         Marland Kitchen METOCLOPRAMIDE HCL 10 MG PO TABS   Oral   Take 10 mg by mouth every 6 (six) hours as needed. For nausea          . OXYCODONE HCL 5 MG PO CAPS   Oral   Take 5 mg by mouth every 4 (four) hours as needed.           BP 100/59  Pulse 70  Temp 97.7 F (36.5 C) (Oral)  Resp 16  SpO2 97%  Physical Exam  Constitutional: She is oriented to person, place, and time. She appears well-developed  and well-nourished.  HENT:  Head: Normocephalic.  Eyes: Pupils are equal, round, and reactive to light.  Neck: Normal range of motion.  Cardiovascular: Normal rate.   Pulmonary/Chest: Effort normal.  Abdominal: Soft. Bowel sounds are normal. She exhibits no distension. There is no tenderness.  Musculoskeletal: Normal range of motion. She exhibits no edema and no tenderness.       No mass behind knee no breaks in the skin no swelling negative Homans sign   Neurological: She is alert and oriented to person, place, and time.  Skin: Skin is warm. No rash noted. No erythema.    ED Course  Procedures (including critical care time)   Labs Reviewed  URINALYSIS, ROUTINE W REFLEX MICROSCOPIC  POCT PREGNANCY, URINE  CBC WITH DIFFERENTIAL  D-DIMER, QUANTITATIVE   No results  found.   No diagnosis found.    MDM  Will hydrate treat nausea, Negative UA and pregnancy test doubt DVT but will check D Dimer Report give to H. Alfredo Martinez PA        Arman Filter, NP 12/27/11 2128

## 2011-12-31 NOTE — ED Provider Notes (Signed)
Medical screening examination/treatment/procedure(s) were performed by non-physician practitioner and as supervising physician I was immediately available for consultation/collaboration.   Hurman Horn, MD 12/31/11 [phone]

## 2012-01-21 DIAGNOSIS — N898 Other specified noninflammatory disorders of vagina: Secondary | ICD-10-CM | POA: Insufficient documentation

## 2012-01-21 DIAGNOSIS — D573 Sickle-cell trait: Secondary | ICD-10-CM | POA: Insufficient documentation

## 2012-01-21 DIAGNOSIS — M4120 Other idiopathic scoliosis, site unspecified: Secondary | ICD-10-CM | POA: Insufficient documentation

## 2012-01-21 DIAGNOSIS — Z79899 Other long term (current) drug therapy: Secondary | ICD-10-CM | POA: Insufficient documentation

## 2012-01-21 DIAGNOSIS — Z3202 Encounter for pregnancy test, result negative: Secondary | ICD-10-CM | POA: Insufficient documentation

## 2012-01-22 ENCOUNTER — Encounter (HOSPITAL_COMMUNITY): Payer: Self-pay | Admitting: Emergency Medicine

## 2012-01-22 ENCOUNTER — Emergency Department (HOSPITAL_COMMUNITY)
Admission: EM | Admit: 2012-01-22 | Discharge: 2012-01-22 | Disposition: A | Discharge status: Home / Self Care | Payer: Medicaid Other | Attending: Emergency Medicine | Admitting: Emergency Medicine

## 2012-01-22 DIAGNOSIS — N939 Abnormal uterine and vaginal bleeding, unspecified: Secondary | ICD-10-CM

## 2012-01-22 LAB — CBC WITH DIFFERENTIAL/PLATELET
Basophils Relative: 1 % (ref 0–1)
Eosinophils Absolute: 0.1 10*3/uL (ref 0.0–0.7)
Hemoglobin: 12.6 g/dL (ref 12.0–15.0)
Lymphs Abs: 3 10*3/uL (ref 0.7–4.0)
MCH: 29.9 pg (ref 26.0–34.0)
MCHC: 34.8 g/dL (ref 30.0–36.0)
Monocytes Relative: 9 % (ref 3–12)
Neutro Abs: 4.4 10*3/uL (ref 1.7–7.7)
Neutrophils Relative %: 54 % (ref 43–77)
Platelets: 284 10*3/uL (ref 150–400)
RBC: 4.21 MIL/uL (ref 3.87–5.11)

## 2012-01-22 LAB — URINALYSIS, ROUTINE W REFLEX MICROSCOPIC
Glucose, UA: NEGATIVE mg/dL
Ketones, ur: 15 mg/dL — AB
Leukocytes, UA: NEGATIVE
Protein, ur: NEGATIVE mg/dL
pH: 6 (ref 5.0–8.0)

## 2012-01-22 LAB — COMPREHENSIVE METABOLIC PANEL
ALT: 15 U/L (ref 0–35)
AST: 26 U/L (ref 0–37)
Albumin: 3.4 g/dL — ABNORMAL LOW (ref 3.5–5.2)
Alkaline Phosphatase: 67 U/L (ref 39–117)
Chloride: 102 mEq/L (ref 96–112)
Potassium: 3.3 mEq/L — ABNORMAL LOW (ref 3.5–5.1)
Sodium: 138 mEq/L (ref 135–145)
Total Bilirubin: 0.2 mg/dL — ABNORMAL LOW (ref 0.3–1.2)
Total Protein: 6.5 g/dL (ref 6.0–8.3)

## 2012-01-22 LAB — URINE MICROSCOPIC-ADD ON

## 2012-01-22 MED ORDER — ALBUTEROL SULFATE HFA 108 (90 BASE) MCG/ACT IN AERS: 1.0000 | INHALATION_SPRAY | Freq: Four times a day (QID) | RESPIRATORY_TRACT | Status: DC | PRN

## 2012-01-22 MED ORDER — POTASSIUM CHLORIDE CRYS ER 20 MEQ PO TBCR
20.0000 meq | EXTENDED_RELEASE_TABLET | Freq: Once | ORAL | Status: AC
  Administered 2012-01-22: 20 meq via ORAL
  Filled 2012-01-22: qty 1

## 2012-01-22 MED ORDER — ALBUTEROL SULFATE HFA 108 (90 BASE) MCG/ACT IN AERS
2.0000 | INHALATION_SPRAY | RESPIRATORY_TRACT | Status: DC | PRN
  Administered 2012-01-22: 2 via RESPIRATORY_TRACT
  Filled 2012-01-22: qty 6.7

## 2012-01-22 NOTE — ED Notes (Signed)
History of tubal ligation -- has recently been tested and told that it is possible to get pregnant again.

## 2012-01-22 NOTE — ED Notes (Signed)
Patient reports that she has had vaginal bleeding since Thursday, but that it has become heavier today.  Patient reports that she has gone through several pads and tissues all day.  Reports quarter sized blood clots.  Last menstrual period beginning of November.  Patient is due for her period, but that she recently took a pregnancy test and that it;s positive.  Reports abdominal cramping and right leg pain.  Patient ambulatory in triage.

## 2012-01-22 NOTE — ED Provider Notes (Signed)
History     CSN: [id number]  Arrival date & time 01/21/12  2350   None     Chief Complaint  Patient presents with  . Vaginal Bleeding    (Consider location/radiation/quality/duration/timing/severity/associated sxs/prior treatment) Patient is a 26 y.o. female presenting with vaginal bleeding. The history is provided by the patient and a friend. No language interpreter was used.  Vaginal Bleeding This is a new problem. The current episode started in the past 7 days. The problem occurs constantly. Associated symptoms include fatigue. Pertinent negatives include no abdominal pain, fever, nausea, vomiting or weakness.   26 year old female coming in today with heavy vaginal bleeding and wants to know if she's pregnant. Patient states that she's been through one box of pads since 2 days ago. States that she can't remember when her last period was but she thinks it was a month ago. She is new in town and has recently moved here from term and has no GYN Dr. Patient denies any abdominal pain but states that she has been passing clots and she doesn't usually passed clots with her period. Patient has been in the ER 6 hours and has changed her pad twice. Patient also denies any dizziness.  Past Medical History  Diagnosis Date  . Scoliosis   . Sickle cell trait   . Hx of tubal ligation     History reviewed. No pertinent past surgical history.  History reviewed. No pertinent family history.  History  Substance Use Topics  . Smoking status: Passive Smoke Exposure - Never Smoker    Types: Cigarettes  . Smokeless tobacco: Not on file  . Alcohol Use: No    OB History    Grav Para Term Preterm Abortions TAB SAB Ect Mult Living                  Review of Systems  Constitutional: Positive for fatigue. Negative for fever, activity change and appetite change.  HENT: Negative.   Eyes: Negative.   Respiratory: Negative.   Cardiovascular: Negative.   Gastrointestinal: Negative.  Negative for  nausea, vomiting, abdominal pain, diarrhea, blood in stool and abdominal distention.  Genitourinary: Positive for vaginal bleeding. Negative for dysuria, urgency, hematuria, vaginal discharge, difficulty urinating and vaginal pain.  Musculoskeletal: Negative for gait problem.  Neurological: Negative.  Negative for dizziness, weakness and light-headedness.  Psychiatric/Behavioral: Negative.   All other systems reviewed and are negative.    Allergies  Other  Home Medications   Current Outpatient Rx  Name  Route  Sig  Dispense  Refill  . ALBUTEROL SULFATE HFA 108 (90 BASE) MCG/ACT IN AERS   Inhalation   Inhale 1-2 puffs into the lungs every 6 (six) hours as needed for wheezing.   1 Inhaler   0   . METOCLOPRAMIDE HCL 10 MG PO TABS   Oral   Take 10 mg by mouth every 6 (six) hours as needed. For nausea          . OXYCODONE HCL 5 MG PO CAPS   Oral   Take 5 mg by mouth every 4 (four) hours as needed. For pain           BP 101/64  Pulse 78  Temp 97.8 F (36.6 C) (Oral)  Resp 20  SpO2 96%  LMP 01/16/2012  Physical Exam  Nursing note and vitals reviewed. Constitutional: She is oriented to person, place, and time. She appears well-developed and well-nourished.  HENT:  Head: Normocephalic and atraumatic.  Eyes: Conjunctivae normal  and EOM are normal. Pupils are equal, round, and reactive to light.  Neck: Normal range of motion. Neck supple.  Cardiovascular: Normal rate.   Pulmonary/Chest: Effort normal and breath sounds normal.  Abdominal: Soft. Bowel sounds are normal. She exhibits no distension. There is no tenderness.  Musculoskeletal: Normal range of motion. She exhibits no edema and no tenderness.  Neurological: She is alert and oriented to person, place, and time. She has normal reflexes.  Skin: Skin is warm and dry.  Psychiatric: She has a normal mood and affect.    ED Course  Procedures (including critical care time)  Labs Reviewed  COMPREHENSIVE METABOLIC  PANEL - Abnormal; Notable for the following:    Potassium 3.3 (*)     Albumin 3.4 (*)     Total Bilirubin 0.2 (*)     All other components within normal limits  URINALYSIS, ROUTINE W REFLEX MICROSCOPIC - Abnormal; Notable for the following:    Hgb urine dipstick LARGE (*)     Bilirubin Urine SMALL (*)     Ketones, ur 15 (*)     All other components within normal limits  CBC WITH DIFFERENTIAL  POCT PREGNANCY, URINE  URINE MICROSCOPIC-ADD ON   No results found.   No diagnosis found.    MDM  26 year old female with heavy vaginal bleeding and wants to know if she's pregnant. Negative pregnancy test. Patient went straight to pads and 6 hours in the ER. Hemoglobin is stable at 12.6. Denies pain nausea vomiting or dizziness. Patient will followup at the [facility] health clinic or the health department. She also needs a refill of her albuterol which we will give her a prescription for.        Remi Haggard, NP 01/22/12 [phone]

## 2012-01-23 ENCOUNTER — Encounter (HOSPITAL_COMMUNITY): Payer: Self-pay | Admitting: Emergency Medicine

## 2012-01-23 ENCOUNTER — Emergency Department (HOSPITAL_COMMUNITY)
Admission: EM | Admit: 2012-01-23 | Discharge: 2012-01-23 | Disposition: A | Discharge status: Home / Self Care | Payer: Medicaid Other | Attending: Emergency Medicine | Admitting: Emergency Medicine

## 2012-01-23 DIAGNOSIS — Z79899 Other long term (current) drug therapy: Secondary | ICD-10-CM | POA: Insufficient documentation

## 2012-01-23 DIAGNOSIS — R609 Edema, unspecified: Secondary | ICD-10-CM | POA: Insufficient documentation

## 2012-01-23 DIAGNOSIS — R61 Generalized hyperhidrosis: Secondary | ICD-10-CM | POA: Insufficient documentation

## 2012-01-23 DIAGNOSIS — M79669 Pain in unspecified lower leg: Secondary | ICD-10-CM

## 2012-01-23 DIAGNOSIS — M25569 Pain in unspecified knee: Secondary | ICD-10-CM | POA: Insufficient documentation

## 2012-01-23 DIAGNOSIS — D573 Sickle-cell trait: Secondary | ICD-10-CM | POA: Insufficient documentation

## 2012-01-23 DIAGNOSIS — M4120 Other idiopathic scoliosis, site unspecified: Secondary | ICD-10-CM | POA: Insufficient documentation

## 2012-01-23 MED ORDER — TRAMADOL HCL 50 MG PO TABS: 50.0000 mg | ORAL_TABLET | Freq: Four times a day (QID) | ORAL | Status: DC | PRN

## 2012-01-23 MED ORDER — TRAMADOL HCL 50 MG PO TABS
50.0000 mg | ORAL_TABLET | Freq: Once | ORAL | Status: AC
  Administered 2012-01-23: 50 mg via ORAL
  Filled 2012-01-23: qty 1

## 2012-01-23 NOTE — ED Notes (Signed)
Ortho at bedside for crutches 

## 2012-01-23 NOTE — ED Provider Notes (Signed)
Medical screening examination/treatment/procedure(s) were performed by non-physician practitioner and as supervising physician I was immediately available for consultation/collaboration.  [name], MD 01/23/12 2330

## 2012-01-23 NOTE — ED Notes (Signed)
Pt states she had a right leg fracture one year ago and her leg has not been right since.  States one week ago she started having pain and weakness in same leg.  Denies further injury to RLE.  States he RLE has been giving out on her the past week.  No pain relief from Ibuprofen or tylenol.  Rates pain 9/10 constant.

## 2012-01-23 NOTE — ED Provider Notes (Signed)
History     CSN: [id number]  Arrival date & time 01/23/12  2050   First MD Initiated Contact with Patient 01/23/12 2149      Chief Complaint  Patient presents with  . Leg Pain   HPI  Patient seen and evaluated. Patient is a 26 year old female currently homeless who presents with complaints of right lower extremity pain. Patient states she has episodes of pain in her right lower extremity the cause it to "give out" on her at times. Patient denies having any injury or fall causing more pain. She does report that she feels the right lower leg is swollen compared to the left especially around the ankle calf area. Patient reports having similar symptoms in the past. Pain has been worse over the past one week. She has been using some over-the-counter pain medications without any improvement. She denies any other associated symptoms. Denies any chest pain, shortness of breath or heart palpitations. She does have history of asthma but has not had any wheezing recently has been using her albuterol inhaler. Denies any recent fever, chills or sweats. Denies any hemoptysis. No prior history of DVT or PE.    Past Medical History  Diagnosis Date  . Scoliosis   . Sickle cell trait   . Hx of tubal ligation     History reviewed. No pertinent past surgical history.  No family history on file.  History  Substance Use Topics  . Smoking status: Passive Smoke Exposure - Never Smoker    Types: Cigarettes  . Smokeless tobacco: Not on file  . Alcohol Use: No    OB History    Grav Para Term Preterm Abortions TAB SAB Ect Mult Living                  Review of Systems  Constitutional: Positive for diaphoresis. Negative for fever and chills.  Respiratory: Negative for shortness of breath.   Cardiovascular: Positive for leg swelling. Negative for chest pain and palpitations.  Gastrointestinal: Negative for nausea, vomiting and abdominal pain.  Musculoskeletal:       Right lower leg pain  Skin:  Negative for rash.  All other systems reviewed and are negative.    Allergies  Other  Home Medications   Current Outpatient Rx  Name  Route  Sig  Dispense  Refill  . ALBUTEROL SULFATE HFA 108 (90 BASE) MCG/ACT IN AERS   Inhalation   Inhale 1-2 puffs into the lungs every 6 (six) hours as needed for wheezing.   1 Inhaler   0   . METOCLOPRAMIDE HCL 10 MG PO TABS   Oral   Take 10 mg by mouth every 6 (six) hours as needed. For nausea          . OXYCODONE HCL 5 MG PO CAPS   Oral   Take 5 mg by mouth every 4 (four) hours as needed. For pain           BP 96/59  Temp 99.2 F (37.3 C) (Oral)  Resp 18  SpO2 100%  LMP 01/16/2012  Physical Exam  Nursing note and vitals reviewed. Constitutional: She is oriented to person, place, and time. She appears well-developed and well-nourished. No distress.  HENT:  Head: Normocephalic.  Cardiovascular: Normal rate and regular rhythm.   No murmur heard. Pulmonary/Chest: Effort normal and breath sounds normal. No respiratory distress. She has no wheezes. She has no rales.  Musculoskeletal: She exhibits tenderness. She exhibits no edema.  There is no appreciable asymmetrical swelling of the lower extremities. Patient reports tenderness with palpation of the right lower extremity. The skin is normal in appearance and temperature. She has normal dorsal pedal pulses. Normal movements in the ankle and toes. Knee exam also normal. Negative Homans sign.  Neurological: She is alert and oriented to person, place, and time.  Skin: Skin is warm and dry. No rash noted. No erythema.  Psychiatric: She has a normal mood and affect. Her behavior is normal.    ED Course  Procedures      1. Lower leg pain       MDM  9:50PM patient seen and evaluated. Patient resting comfortably in no acute distress. No significant findings on exam. Patient was seen with unremarkable extremity exam yesterday. Patient has similar symptoms in the past. She  was seen last month for these symptoms had a d-dimer to rule out DVT which was negative. Patient has no new injury or trauma. At this time we'll schedule for outpatient Doppler study but there is very low suspicion for DVT and I do not feel anticoagulation is necessary with Lovenox at this time. Patient will also be given orthopedic referral.  Prescription for Ultram given.      Angus Seller, [place] 01/23/12 2326

## 2012-01-23 NOTE — ED Notes (Signed)
Pt denies any questions upon discharge. 

## 2012-01-23 NOTE — ED Notes (Signed)
Ortho notified crutches needed prior to discharge.

## 2012-01-23 NOTE — Progress Notes (Signed)
Orthopedic Tech Progress Note Patient Details:  [name] 22-Nov-1985 [id number]  Ortho Devices Type of Ortho Device: Crutches   Haskell Flirt 01/23/2012, 11:39 PM

## 2012-01-23 NOTE — ED Notes (Signed)
C/o R leg pain x 1 week.  States R leg gave out while walking 1 week ago.  Pt to ED via [facility]

## 2012-01-24 ENCOUNTER — Emergency Department (HOSPITAL_COMMUNITY)
Admission: EM | Admit: 2012-01-24 | Discharge: 2012-01-25 | Disposition: A | Discharge status: Home / Self Care | Payer: Medicaid Other | Attending: Emergency Medicine | Admitting: Emergency Medicine

## 2012-01-24 ENCOUNTER — Encounter (HOSPITAL_COMMUNITY): Payer: Self-pay | Admitting: Emergency Medicine

## 2012-01-24 DIAGNOSIS — D573 Sickle-cell trait: Secondary | ICD-10-CM | POA: Insufficient documentation

## 2012-01-24 DIAGNOSIS — M79606 Pain in leg, unspecified: Secondary | ICD-10-CM

## 2012-01-24 DIAGNOSIS — M4120 Other idiopathic scoliosis, site unspecified: Secondary | ICD-10-CM | POA: Insufficient documentation

## 2012-01-24 DIAGNOSIS — M79609 Pain in unspecified limb: Secondary | ICD-10-CM | POA: Insufficient documentation

## 2012-01-24 MED ORDER — HYDROCODONE-ACETAMINOPHEN 5-325 MG PO TABS
2.0000 | ORAL_TABLET | Freq: Once | ORAL | Status: AC
  Administered 2012-01-25: 2 via ORAL
  Filled 2012-01-24: qty 2

## 2012-01-24 NOTE — ED Notes (Signed)
PT. REPORTS PERSISTENT RIGHT LOWER LEG PAIN FOR SEVERAL DAYS SEEN HERE LAST NIGHT DISCHARGED WITH CRUTCHES AND PRESCRIPTION PAIN MEDICATIONS WHICH SHE DID NOT FILL .

## 2012-01-24 NOTE — ED Notes (Signed)
Pt states that she was here last night with the same complaint. Pt states "they just gave me crutches and a pain pill and sent me home." Pt in no obvious distress at this time.

## 2012-01-24 NOTE — ED Provider Notes (Signed)
Medical screening examination/treatment/procedure(s) were performed by non-physician practitioner and as supervising physician I was immediately available for consultation/collaboration.  Sahir Tolson, MD 01/24/12 1516 

## 2012-01-24 NOTE — ED Provider Notes (Signed)
History   This chart was scribed for Evelyn Kaplan, MD, by [name], ER scribe. The patient was seen in room TR11C/TR11C and the patient's care was started at 2246.    CSN: [id number]  Arrival date & time 01/24/12  2233   First MD Initiated Contact with Patient 01/24/12 2246      Chief Complaint  Patient presents with  . Leg Pain    (Consider location/radiation/quality/duration/timing/severity/associated sxs/prior treatment) HPI Comments: [name] is a 26 y.o. female who presents to the Emergency Department complaining of constant, moderate right lower leg pain that began last month from an injury more than a year ago. She states that she is ambulatory with pain. Pain worse with ambulation. Pt has an Orthopedic appt on Thursday, but has no pain meds, so she came to the ER. No new trauma. Pain is located in the anterior ankle. Pt hs insurance, but doesn't have the $4 copay.    Past Medical History  Diagnosis Date  . Scoliosis   . Sickle cell trait   . Hx of tubal ligation     No past surgical history on file.  No family history on file.  History  Substance Use Topics  . Smoking status: Passive Smoke Exposure - Never Smoker    Types: Cigarettes  . Smokeless tobacco: Not on file  . Alcohol Use: No    OB History    Grav Para Term Preterm Abortions TAB SAB Ect Mult Living                  Review of Systems  Musculoskeletal:       Leg pain  All other systems reviewed and are negative.    Allergies  Other  Home Medications   Current Outpatient Rx  Name  Route  Sig  Dispense  Refill  . ACETAMINOPHEN 325 MG PO TABS   Oral   Take 650 mg by mouth every 6 (six) hours as needed. For pain         . ALBUTEROL SULFATE HFA 108 (90 BASE) MCG/ACT IN AERS   Inhalation   Inhale 1-2 puffs into the lungs every 6 (six) hours as needed for wheezing.   1 Inhaler   0   . IBUPROFEN 200 MG PO TABS   Oral   Take 200 mg by mouth every 6 (six) hours as needed.  For pain         . OXYCODONE HCL 5 MG PO CAPS   Oral   Take 5 mg by mouth every 4 (four) hours as needed. For pain         . METOCLOPRAMIDE HCL 10 MG PO TABS   Oral   Take 10 mg by mouth every 6 (six) hours as needed. For nausea          . TRAMADOL HCL 50 MG PO TABS   Oral   Take 1 tablet (50 mg total) by mouth every 6 (six) hours as needed for pain.   10 tablet   0     BP 102/67  Pulse 107  Temp 98.4 F (36.9 C) (Oral)  Resp 20  SpO2 97%  LMP 01/16/2012  Physical Exam  Nursing note and vitals reviewed. Musculoskeletal: She exhibits tenderness. She exhibits no edema.       She has no unilateral swelling. She has tenderness over the popliteal region with palpation and the anterior ankle. No evidence of infection.    ED Course  Procedures (including critical  care time)  DIAGNOSTIC STUDIES: Oxygen Saturation is 97% on room air, normal by my interpretation.    COORDINATION OF CARE:  23:05- Discussed planned course of treatment with the patient, who is agreeable at this time.   Labs Reviewed - No data to display No results found.   No diagnosis found.    MDM  I personally performed the services described in this documentation, which was scribed in my presence. The recorded information has been reviewed and is accurate.  Pt comes in with cc of ankle pain and popliteal pain. There is no exam evidence of infection. She had an injury more than a year ago, and is having reproducible ankle pain. I dont think she has a DVT either based on the exam.  Pt has been provided with Orthopedic follow up by prior providers, and has an appointment this week.  She is taking motrin for pain, gets no relief. She has scripts for narcotics, but no money. Her injury isnt acute, and i dont think she needs to be on narcotics. Will give her pain meds po this visit, and discharge.  I explained to her that Xray will not show any abnormality, as even if she has injury, it is  musculoskeletal or ligamentous. She already has crutches and ace wrap.     Evelyn Kaplan, MD 01/24/12 2351

## 2012-01-25 ENCOUNTER — Emergency Department (HOSPITAL_COMMUNITY)
Admission: EM | Admit: 2012-01-25 | Discharge: 2012-01-25 | Disposition: A | Discharge status: Home / Self Care | Payer: Medicaid Other | Attending: Emergency Medicine | Admitting: Emergency Medicine

## 2012-01-25 ENCOUNTER — Encounter (HOSPITAL_COMMUNITY): Payer: Self-pay | Admitting: Emergency Medicine

## 2012-01-25 DIAGNOSIS — M25579 Pain in unspecified ankle and joints of unspecified foot: Secondary | ICD-10-CM | POA: Insufficient documentation

## 2012-01-25 DIAGNOSIS — Z791 Long term (current) use of non-steroidal anti-inflammatories (NSAID): Secondary | ICD-10-CM | POA: Insufficient documentation

## 2012-01-25 DIAGNOSIS — D573 Sickle-cell trait: Secondary | ICD-10-CM | POA: Insufficient documentation

## 2012-01-25 DIAGNOSIS — Z79899 Other long term (current) drug therapy: Secondary | ICD-10-CM | POA: Insufficient documentation

## 2012-01-25 DIAGNOSIS — M4120 Other idiopathic scoliosis, site unspecified: Secondary | ICD-10-CM | POA: Insufficient documentation

## 2012-01-25 MED ORDER — HYDROCODONE-ACETAMINOPHEN 5-325 MG PO TABS
1.0000 | ORAL_TABLET | Freq: Once | ORAL | Status: AC
  Administered 2012-01-25: 1 via ORAL
  Filled 2012-01-25: qty 1

## 2012-01-25 NOTE — ED Notes (Addendum)
Per EMS: c/o of right ankle pain, no swelling or deformity but weakness. Pt states she cannot put pressure on ankle or walk without it hurting, she was seen [facility] 2 days ago and was given pain meds and crutches.

## 2012-01-25 NOTE — ED Provider Notes (Signed)
History     CSN: [id number]  Arrival date & time 01/25/12  1515   First MD Initiated Contact with Patient 01/25/12 1523      Chief Complaint  Patient presents with  . Ankle Pain    right    (Consider location/radiation/quality/duration/timing/severity/associated sxs/prior treatment) HPI Comments: Patients with recent visit to the emergency department yesterday for the same complaint, presents with complaint of right ankle and foot pain. Patient states that she has had this for the past 2 years from injury. Over the past week and a half pain has been worse and denies new injuries to the area. She has had trouble walking on the leg. She's been using crutches. Patient was supposed to see Dr. Luiz Blare today however she states that she got lost going to the office and did not see them. She states that she was given a prescription for medications that she cannot fill due to cost. Onset gradual. Course is constant. Nothing symptoms better.  The history is provided by the patient.    Past Medical History  Diagnosis Date  . Scoliosis   . Sickle cell trait   . Hx of tubal ligation     History reviewed. No pertinent past surgical history.  No family history on file.  History  Substance Use Topics  . Smoking status: Passive Smoke Exposure - Never Smoker    Types: Cigarettes  . Smokeless tobacco: Not on file  . Alcohol Use: No    OB History    Grav Para Term Preterm Abortions TAB SAB Ect Mult Living                  Review of Systems  Constitutional: Negative for fever.  HENT: Negative for neck pain.   Musculoskeletal: Positive for arthralgias and gait problem. Negative for back pain and joint swelling.  Skin: Negative for wound.  Neurological: Negative for weakness and numbness.    Allergies  Other  Home Medications   Current Outpatient Rx  Name  Route  Sig  Dispense  Refill  . ACETAMINOPHEN 325 MG PO TABS   Oral   Take 650 mg by mouth every 6 (six) hours as needed.  For pain         . ALBUTEROL SULFATE HFA 108 (90 BASE) MCG/ACT IN AERS   Inhalation   Inhale 1-2 puffs into the lungs every 6 (six) hours as needed for wheezing.   1 Inhaler   0   . METOCLOPRAMIDE HCL 10 MG PO TABS   Oral   Take 10 mg by mouth every 6 (six) hours as needed. For nausea          . OXYCODONE HCL 5 MG PO CAPS   Oral   Take 5 mg by mouth every 4 (four) hours as needed. For pain         . TRAMADOL HCL 50 MG PO TABS   Oral   Take 1 tablet (50 mg total) by mouth every 6 (six) hours as needed for pain.   10 tablet   0     BP 98/66  Pulse 66  Temp 98 F (36.7 C) (Oral)  SpO2 99%  LMP 01/16/2012  Physical Exam  Nursing note and vitals reviewed. Constitutional: She appears well-developed and well-nourished.  HENT:  Head: Normocephalic and atraumatic.  Eyes: Conjunctivae normal are normal.  Neck: Normal range of motion. Neck supple.  Cardiovascular:  Pulses:      Dorsalis pedis pulses are 2+ on the  right side, and 2+ on the left side.       Posterior tibial pulses are 2+ on the right side, and 2+ on the left side.  Musculoskeletal: She exhibits tenderness. She exhibits no edema.       Right ankle: She exhibits normal range of motion, no swelling and no ecchymosis. tenderness. No head of 5th metatarsal and no proximal fibula tenderness found. Achilles tendon normal.       Feet:       Patient complains of pain with palpation of the right ankle. She denies pain with palpation over the fibular head of the affected side. She denies pain in the hip of the affected side.  Neurological: She is alert.       Distal motor, sensation, and vascular intact.   Skin: Skin is warm and dry.  Psychiatric: She has a normal mood and affect.    ED Course  Procedures (including critical care time)  Labs Reviewed - No data to display No results found.   1. Ankle pain     5:00 PM Patient seen and examined. ASO by ortho tech. Medications ordered.   Vital signs  reviewed and are as follows: Filed Vitals:   01/25/12 1529  BP: 98/66  Pulse: 66  Temp: 98 F (36.7 C)   Encouraged use of ASO with ortho follow-up for chronic pain.   Patient urged to return with worsening symptoms or other concerns. Patient verbalized understanding and agrees with plan.   Urged to fill previous rx for pain medication.    MDM  Previous note. Patient has ortho follow-up -- although she states she missed her appointment today. She has had pain for 2 years. No new injury. Do not feel x-ray is indicated at this time. No cellulitis. Do not suspect DVT given history and symptoms. Patient has RX for pain medication which she cannot fill. Will give ASO to help stabilize ankle. Pt strongly encouraged to f/u with ortho given chronic nature of pain.         Renne Crigler, [place] 01/27/12 1318

## 2012-01-27 NOTE — ED Provider Notes (Signed)
Medical screening examination/treatment/procedure(s) were performed by non-physician practitioner and as supervising physician I was immediately available for consultation/collaboration.  Donnetta Hutching, MD 01/27/12 1410

## 2012-02-08 ENCOUNTER — Emergency Department (HOSPITAL_COMMUNITY)
Admission: EM | Admit: 2012-02-08 | Discharge: 2012-02-09 | Disposition: A | Discharge status: Home / Self Care | Payer: Medicaid Other | Attending: Emergency Medicine | Admitting: Emergency Medicine

## 2012-02-08 ENCOUNTER — Emergency Department (HOSPITAL_COMMUNITY): Payer: Medicaid Other

## 2012-02-08 ENCOUNTER — Encounter (HOSPITAL_COMMUNITY): Payer: Self-pay

## 2012-02-08 DIAGNOSIS — Z862 Personal history of diseases of the blood and blood-forming organs and certain disorders involving the immune mechanism: Secondary | ICD-10-CM | POA: Insufficient documentation

## 2012-02-08 DIAGNOSIS — Z79899 Other long term (current) drug therapy: Secondary | ICD-10-CM | POA: Insufficient documentation

## 2012-02-08 DIAGNOSIS — J3489 Other specified disorders of nose and nasal sinuses: Secondary | ICD-10-CM | POA: Insufficient documentation

## 2012-02-08 DIAGNOSIS — R059 Cough, unspecified: Secondary | ICD-10-CM | POA: Insufficient documentation

## 2012-02-08 DIAGNOSIS — J069 Acute upper respiratory infection, unspecified: Secondary | ICD-10-CM | POA: Insufficient documentation

## 2012-02-08 DIAGNOSIS — Z3202 Encounter for pregnancy test, result negative: Secondary | ICD-10-CM | POA: Insufficient documentation

## 2012-02-08 DIAGNOSIS — M4120 Other idiopathic scoliosis, site unspecified: Secondary | ICD-10-CM | POA: Insufficient documentation

## 2012-02-08 DIAGNOSIS — R112 Nausea with vomiting, unspecified: Secondary | ICD-10-CM | POA: Insufficient documentation

## 2012-02-08 DIAGNOSIS — IMO0001 Reserved for inherently not codable concepts without codable children: Secondary | ICD-10-CM | POA: Insufficient documentation

## 2012-02-08 DIAGNOSIS — R05 Cough: Secondary | ICD-10-CM

## 2012-02-08 DIAGNOSIS — R6883 Chills (without fever): Secondary | ICD-10-CM | POA: Insufficient documentation

## 2012-02-08 DIAGNOSIS — R5381 Other malaise: Secondary | ICD-10-CM | POA: Insufficient documentation

## 2012-02-08 DIAGNOSIS — R109 Unspecified abdominal pain: Secondary | ICD-10-CM

## 2012-02-08 LAB — COMPREHENSIVE METABOLIC PANEL WITH GFR
ALT: 11 U/L (ref 0–35)
AST: 22 U/L (ref 0–37)
Albumin: 3.6 g/dL (ref 3.5–5.2)
Alkaline Phosphatase: 71 U/L (ref 39–117)
BUN: 7 mg/dL (ref 6–23)
CO2: 27 meq/L (ref 19–32)
Calcium: 9 mg/dL (ref 8.4–10.5)
Chloride: 104 meq/L (ref 96–112)
Creatinine, Ser: 0.7 mg/dL (ref 0.50–1.10)
GFR calc Af Amer: >90 mL/min
GFR calc non Af Amer: >90 mL/min
Glucose, Bld: 89 mg/dL (ref 70–99)
Potassium: 3.5 meq/L (ref 3.5–5.1)
Sodium: 140 meq/L (ref 135–145)
Total Bilirubin: 0.2 mg/dL — ABNORMAL LOW (ref 0.3–1.2)
Total Protein: 7.2 g/dL (ref 6.0–8.3)

## 2012-02-08 LAB — CBC WITH DIFFERENTIAL/PLATELET
Basophils Absolute: 0 10*3/uL (ref 0.0–0.1)
Basophils Relative: 0 % (ref 0–1)
Eosinophils Absolute: 0.1 10*3/uL (ref 0.0–0.7)
Eosinophils Relative: 1 % (ref 0–5)
HCT: 39.3 % (ref 36.0–46.0)
Hemoglobin: 13.6 g/dL (ref 12.0–15.0)
Lymphocytes Relative: 37 % (ref 12–46)
Lymphs Abs: 3 10*3/uL (ref 0.7–4.0)
MCH: 29.4 pg (ref 26.0–34.0)
MCHC: 34.6 g/dL (ref 30.0–36.0)
MCV: 85.1 fL (ref 78.0–100.0)
Monocytes Absolute: 0.5 10*3/uL (ref 0.1–1.0)
Monocytes Relative: 7 % (ref 3–12)
Neutro Abs: 4.5 10*3/uL (ref 1.7–7.7)
Neutrophils Relative %: 55 % (ref 43–77)
Platelets: 371 10*3/uL (ref 150–400)
RBC: 4.62 MIL/uL (ref 3.87–5.11)
RDW: 14.2 % (ref 11.5–15.5)
WBC: 8.2 10*3/uL (ref 4.0–10.5)

## 2012-02-08 LAB — LIPASE, BLOOD: Lipase: 21 U/L (ref 11–59)

## 2012-02-08 LAB — URINALYSIS, ROUTINE W REFLEX MICROSCOPIC
Bilirubin Urine: NEGATIVE
Bilirubin Urine: NEGATIVE
Glucose, UA: NEGATIVE mg/dL
Glucose, UA: NEGATIVE mg/dL
Hgb urine dipstick: NEGATIVE
Hgb urine dipstick: NEGATIVE
Ketones, ur: NEGATIVE mg/dL
Ketones, ur: NEGATIVE mg/dL
Nitrite: NEGATIVE
Nitrite: NEGATIVE
Protein, ur: NEGATIVE mg/dL
Protein, ur: NEGATIVE mg/dL
Specific Gravity, Urine: 1.019 (ref 1.005–1.030)
Specific Gravity, Urine: 1.02 (ref 1.005–1.030)
Urobilinogen, UA: 0.2 mg/dL (ref 0.0–1.0)
Urobilinogen, UA: 1 mg/dL (ref 0.0–1.0)
pH: 6.5 (ref 5.0–8.0)
pH: 6 (ref 5.0–8.0)

## 2012-02-08 LAB — URINE MICROSCOPIC-ADD ON

## 2012-02-08 LAB — PREGNANCY, URINE: Preg Test, Ur: NEGATIVE

## 2012-02-08 MED ORDER — ONDANSETRON HCL 4 MG/2ML IJ SOLN
4.0000 mg | Freq: Once | INTRAMUSCULAR | Status: AC
  Administered 2012-02-08: 4 mg via INTRAVENOUS
  Filled 2012-02-08: qty 2

## 2012-02-08 MED ORDER — CEFTRIAXONE SODIUM 1 G IJ SOLR
1.0000 g | Freq: Once | INTRAMUSCULAR | Status: AC
  Administered 2012-02-09: 1 g via INTRAVENOUS
  Filled 2012-02-08: qty 10

## 2012-02-08 MED ORDER — SODIUM CHLORIDE 0.9 % IV BOLUS (SEPSIS)
1000.0000 mL | Freq: Once | INTRAVENOUS | Status: AC
  Administered 2012-02-08: 1000 mL via INTRAVENOUS

## 2012-02-08 NOTE — ED Notes (Signed)
The pt is c/o having a cold abd pain with nv and her feet are hurting for 3-4 days.  lmp last week

## 2012-02-08 NOTE — ED Notes (Signed)
Patient homeless; called EMS with c/o "flu-like sx" ... VSS, AAOx4

## 2012-02-08 NOTE — ED Provider Notes (Signed)
History     CSN: [id number]  Arrival date & time 02/08/12  2037   First MD Initiated Contact with Patient 02/08/12 2135      Chief Complaint  Patient presents with  . URI    (Consider location/radiation/quality/duration/timing/severity/associated sxs/prior treatment) HPI [name] is a 26 y.o. female who presents to ED complaining of pain in the right flank, chills, nasal congestion, cough for about 4 days. Pt states vomiting for the last two days, unable to keep anything down. Taking tylenol, and reglan that she had left over and it is not helping. Denies headache, neck pain or stiffness, denies chest pain, abdominal pain, diarrhea, no urinary or vaginal symptoms. No other complaints.    Past Medical History  Diagnosis Date  . Scoliosis   . Sickle cell trait   . Hx of tubal ligation     History reviewed. No pertinent past surgical history.  No family history on file.  History  Substance Use Topics  . Smoking status: Passive Smoke Exposure - Never Smoker    Types: Cigarettes  . Smokeless tobacco: Not on file  . Alcohol Use: No    OB History    Grav Para Term Preterm Abortions TAB SAB Ect Mult Living                  Review of Systems  Constitutional: Positive for chills and fatigue.  Respiratory: Positive for cough. Negative for chest tightness and shortness of breath.   Cardiovascular: Negative for chest pain, palpitations and leg swelling.  Gastrointestinal: Positive for nausea. Negative for abdominal pain and diarrhea.  Genitourinary: Positive for flank pain. Negative for dysuria, urgency, frequency, vaginal bleeding and vaginal discharge.  Musculoskeletal: Positive for myalgias. Negative for back pain.  Skin: Negative.   Neurological: Positive for weakness. Negative for dizziness, light-headedness and headaches.    Allergies  Other  Home Medications   Current Outpatient Rx  Name  Route  Sig  Dispense  Refill  . ACETAMINOPHEN 325 MG PO TABS  Oral   Take 650 mg by mouth every 6 (six) hours as needed. For pain         . ALBUTEROL SULFATE HFA 108 (90 BASE) MCG/ACT IN AERS   Inhalation   Inhale 1-2 puffs into the lungs every 6 (six) hours as needed for wheezing.   1 Inhaler   0   . OXYCODONE HCL 5 MG PO CAPS   Oral   Take 5 mg by mouth every 4 (four) hours as needed. For pain         . TRAMADOL HCL 50 MG PO TABS   Oral   Take 1 tablet (50 mg total) by mouth every 6 (six) hours as needed for pain.   10 tablet   0   . METOCLOPRAMIDE HCL 10 MG PO TABS   Oral   Take 10 mg by mouth every 6 (six) hours as needed. For nausea            BP 100/58  Pulse 64  Temp 99.9 F (37.7 C) (Oral)  Resp 18  SpO2 100%  LMP 01/16/2012  Physical Exam  Nursing note and vitals reviewed. Constitutional: She is oriented to person, place, and time. She appears well-developed and well-nourished. No distress.  HENT:  Head: Normocephalic and atraumatic.  Right Ear: External ear normal.  Left Ear: External ear normal.  Nose: Nose normal.  Mouth/Throat: Oropharynx is clear and moist.  Eyes: Conjunctivae normal are normal. Pupils are  equal, round, and reactive to light.  Neck: Neck supple.  Cardiovascular: Normal rate, regular rhythm and normal heart sounds.   Pulmonary/Chest: Effort normal and breath sounds normal. No respiratory distress. She has no wheezes. She has no rales.  Abdominal: Soft. Bowel sounds are normal. She exhibits no distension. There is no tenderness. There is no rebound.       Right CVA tenderness  Musculoskeletal: She exhibits no edema.  Neurological: She is alert and oriented to person, place, and time.  Skin: Skin is warm and dry. No rash noted.  Psychiatric: She has a normal mood and affect. Her behavior is normal.    ED Course  Procedures (including critical care time)  PT with right CVA tenderness, low grade fever. Suspect pyelonephritis. Pt also coughing. Lungs clear. Labs, CXR, UA peniding  Results  for orders placed during the hospital encounter of 02/08/12  URINALYSIS, ROUTINE W REFLEX MICROSCOPIC      Component Value Range   Color, Urine YELLOW  YELLOW   APPearance CLOUDY (*) CLEAR   Specific Gravity, Urine 1.019  1.005 - 1.030   pH 6.5  5.0 - 8.0   Glucose, UA NEGATIVE  NEGATIVE mg/dL   Hgb urine dipstick NEGATIVE  NEGATIVE   Bilirubin Urine NEGATIVE  NEGATIVE   Ketones, ur NEGATIVE  NEGATIVE mg/dL   Protein, ur NEGATIVE  NEGATIVE mg/dL   Urobilinogen, UA 0.2  0.0 - 1.0 mg/dL   Nitrite NEGATIVE  NEGATIVE   Leukocytes, UA MODERATE (*) NEGATIVE  PREGNANCY, URINE      Component Value Range   Preg Test, Ur NEGATIVE  NEGATIVE  CBC WITH DIFFERENTIAL      Component Value Range   WBC 8.2  4.0 - 10.5 K/uL   RBC 4.62  3.87 - 5.11 MIL/uL   Hemoglobin 13.6  12.0 - 15.0 g/dL   HCT 40.9  81.1 - 91.4 %   MCV 85.1  78.0 - 100.0 fL   MCH 29.4  26.0 - 34.0 pg   MCHC 34.6  30.0 - 36.0 g/dL   RDW 78.2  95.6 - 21.3 %   Platelets 371  150 - 400 K/uL   Neutrophils Relative 55  43 - 77 %   Neutro Abs 4.5  1.7 - 7.7 K/uL   Lymphocytes Relative 37  12 - 46 %   Lymphs Abs 3.0  0.7 - 4.0 K/uL   Monocytes Relative 7  3 - 12 %   Monocytes Absolute 0.5  0.1 - 1.0 K/uL   Eosinophils Relative 1  0 - 5 %   Eosinophils Absolute 0.1  0.0 - 0.7 K/uL   Basophils Relative 0  0 - 1 %   Basophils Absolute 0.0  0.0 - 0.1 K/uL  COMPREHENSIVE METABOLIC PANEL      Component Value Range   Sodium 140  135 - 145 mEq/L   Potassium 3.5  3.5 - 5.1 mEq/L   Chloride 104  96 - 112 mEq/L   CO2 27  19 - 32 mEq/L   Glucose, Bld 89  70 - 99 mg/dL   BUN 7  6 - 23 mg/dL   Creatinine, Ser 0.86  0.50 - 1.10 mg/dL   Calcium 9.0  8.4 - 57.8 mg/dL   Total Protein 7.2  6.0 - 8.3 g/dL   Albumin 3.6  3.5 - 5.2 g/dL   AST 22  0 - 37 U/L   ALT 11  0 - 35 U/L   Alkaline Phosphatase 71  39 - 117 U/L   Total Bilirubin 0.2 (*) 0.3 - 1.2 mg/dL   GFR calc non Af Amer >90  >90 mL/min   GFR calc Af Amer >90  >90 mL/min  LIPASE,  BLOOD      Component Value Range   Lipase 21  11 - 59 U/L  URINE MICROSCOPIC-ADD ON      Component Value Range   Squamous Epithelial / LPF MANY (*) RARE   WBC, UA 11-20  <3 WBC/hpf   RBC / HPF 0-2  <3 RBC/hpf   Bacteria, UA FEW (*) RARE   Urine-Other MUCOUS PRESENT    URINALYSIS, ROUTINE W REFLEX MICROSCOPIC      Component Value Range   Color, Urine YELLOW  YELLOW   APPearance CLOUDY (*) CLEAR   Specific Gravity, Urine 1.020  1.005 - 1.030   pH 6.0  5.0 - 8.0   Glucose, UA NEGATIVE  NEGATIVE mg/dL   Hgb urine dipstick NEGATIVE  NEGATIVE   Bilirubin Urine NEGATIVE  NEGATIVE   Ketones, ur NEGATIVE  NEGATIVE mg/dL   Protein, ur NEGATIVE  NEGATIVE mg/dL   Urobilinogen, UA 1.0  0.0 - 1.0 mg/dL   Nitrite NEGATIVE  NEGATIVE   Leukocytes, UA MODERATE (*) NEGATIVE  URINE MICROSCOPIC-ADD ON      Component Value Range   Squamous Epithelial / LPF MANY (*) RARE   WBC, UA 11-20  <3 WBC/hpf   RBC / HPF 0-2  <3 RBC/hpf   Bacteria, UA FEW (*) RARE   Urine-Other MUCOUS PRESENT     Dg Chest 2 View  02/08/2012  *RADIOLOGY REPORT*  Clinical Data:  Dizziness, URI, asthma  CHEST - 2 VIEW  Comparison: 07/19/2011  Findings: Biconvex thoracolumbar scoliosis. Upper normal heart size. Normal mediastinal contours and pulmonary vascularity. Minimal chronic peribronchial thickening. No pulmonary infiltrate, pleural effusion or pneumothorax. No acute osseous findings.  IMPRESSION: Chronic peribronchial thickening of the pelvis and patient's history of asthma. No acute abnormalities.   Original Report Authenticated By: Ulyses Southward, M.D.     12:10 AM UA repeated due to original contaminated sample and I&O was performed, which showed same contamination and is identical in result. i called lab myself and verified that a different sample was ran the 2nd and was told that it was 2nd sample. Cultures sent. Will treat as pyelo. CXR negative. Labs all unremarkable. Also possible gall bladder, however, LFTs, lipase  normal today, normal WBC, doubt cholecysitis. Will treat with rcephin IV in ed.  1. Right flank pain   2. Nausea & vomiting   3. Cough       MDM  Pt with right flank pain, cough, low grade fever. UA infected, although contaminated even after Cathed sample submitted. Her  CXR clear. Labs all normal. VS normal. She is non toxic. Nausea improved with zofran. She is tolerating PO fluids in ED. She will be discharged home with follow up and PO antibiotics, will start on keflex. Phenergan for nausea.    Filed Vitals:   02/08/12 2047 02/08/12 2355  BP: 100/58 100/40  Pulse: 64 51  Temp: 99.9 F (37.7 C) 98.5 F (36.9 C)  TempSrc: Oral Oral  Resp: 18 14  SpO2: 100% 100%        Lottie Mussel, PA 02/09/12 0101

## 2012-02-09 MED ORDER — KETOROLAC TROMETHAMINE 30 MG/ML IJ SOLN
30.0000 mg | Freq: Once | INTRAMUSCULAR | Status: AC
  Administered 2012-02-09: 30 mg via INTRAVENOUS
  Filled 2012-02-09: qty 1

## 2012-02-09 MED ORDER — CEPHALEXIN 500 MG PO CAPS: 500.0000 mg | ORAL_CAPSULE | Freq: Four times a day (QID) | ORAL | Status: DC

## 2012-02-09 MED ORDER — PROMETHAZINE HCL 12.5 MG PO TABS: 12.5000 mg | ORAL_TABLET | Freq: Four times a day (QID) | ORAL | Status: DC | PRN

## 2012-02-09 NOTE — ED Notes (Signed)
Pt dc to home.  Pt states understanding to dc instructions.  Pt states will f/u with md as directed.  Pt ambulatory to exit without difficulty.

## 2012-02-09 NOTE — ED Provider Notes (Signed)
Medical screening examination/treatment/procedure(s) were performed by non-physician practitioner and as supervising physician I was immediately available for consultation/collaboration.  John-Adam Marvel Sapp, M.D.     John-Adam Debbra Digiulio, MD 02/09/12 0119 

## 2012-02-10 LAB — URINE CULTURE
Colony Count: >=100000
Colony Count: NO GROWTH
Culture: NO GROWTH

## 2012-02-11 ENCOUNTER — Emergency Department (HOSPITAL_COMMUNITY)
Admission: EM | Admit: 2012-02-11 | Discharge: 2012-02-11 | Disposition: A | Discharge status: Home / Self Care | Payer: Medicaid Other | Attending: Emergency Medicine | Admitting: Emergency Medicine

## 2012-02-11 ENCOUNTER — Encounter (HOSPITAL_COMMUNITY): Payer: Self-pay | Admitting: *Deleted

## 2012-02-11 DIAGNOSIS — Z9851 Tubal ligation status: Secondary | ICD-10-CM | POA: Insufficient documentation

## 2012-02-11 DIAGNOSIS — R0602 Shortness of breath: Secondary | ICD-10-CM

## 2012-02-11 DIAGNOSIS — J45909 Unspecified asthma, uncomplicated: Secondary | ICD-10-CM | POA: Insufficient documentation

## 2012-02-11 DIAGNOSIS — Z5900 Homelessness unspecified: Secondary | ICD-10-CM | POA: Insufficient documentation

## 2012-02-11 DIAGNOSIS — M4120 Other idiopathic scoliosis, site unspecified: Secondary | ICD-10-CM | POA: Insufficient documentation

## 2012-02-11 DIAGNOSIS — G8929 Other chronic pain: Secondary | ICD-10-CM | POA: Insufficient documentation

## 2012-02-11 DIAGNOSIS — Z3202 Encounter for pregnancy test, result negative: Secondary | ICD-10-CM | POA: Insufficient documentation

## 2012-02-11 DIAGNOSIS — M25579 Pain in unspecified ankle and joints of unspecified foot: Secondary | ICD-10-CM | POA: Insufficient documentation

## 2012-02-11 DIAGNOSIS — F17200 Nicotine dependence, unspecified, uncomplicated: Secondary | ICD-10-CM | POA: Insufficient documentation

## 2012-02-11 DIAGNOSIS — M79609 Pain in unspecified limb: Secondary | ICD-10-CM

## 2012-02-11 DIAGNOSIS — R112 Nausea with vomiting, unspecified: Secondary | ICD-10-CM | POA: Insufficient documentation

## 2012-02-11 DIAGNOSIS — Z79899 Other long term (current) drug therapy: Secondary | ICD-10-CM | POA: Insufficient documentation

## 2012-02-11 DIAGNOSIS — D573 Sickle-cell trait: Secondary | ICD-10-CM | POA: Insufficient documentation

## 2012-02-11 DIAGNOSIS — E860 Dehydration: Secondary | ICD-10-CM | POA: Insufficient documentation

## 2012-02-11 LAB — PREGNANCY, URINE: Preg Test, Ur: NEGATIVE

## 2012-02-11 LAB — CBC
MCH: 28.4 pg (ref 26.0–34.0)
MCHC: 33.4 g/dL (ref 30.0–36.0)
MCV: 84.9 fL (ref 78.0–100.0)
Platelets: 316 10*3/uL (ref 150–400)
RDW: 14 % (ref 11.5–15.5)

## 2012-02-11 LAB — POCT I-STAT, CHEM 8
BUN: <3 mg/dL — ABNORMAL LOW (ref 6–23)
Calcium, Ion: 1.12 mmol/L (ref 1.12–1.23)
Creatinine, Ser: 0.9 mg/dL (ref 0.50–1.10)
Glucose, Bld: 86 mg/dL (ref 70–99)
Hemoglobin: 14.6 g/dL (ref 12.0–15.0)
Sodium: 142 mEq/L (ref 135–145)
TCO2: 27 mmol/L (ref 0–100)

## 2012-02-11 LAB — URINALYSIS, ROUTINE W REFLEX MICROSCOPIC
Bilirubin Urine: NEGATIVE
Glucose, UA: NEGATIVE mg/dL
Hgb urine dipstick: NEGATIVE
Protein, ur: NEGATIVE mg/dL
Urobilinogen, UA: 0.2 mg/dL (ref 0.0–1.0)

## 2012-02-11 LAB — URINE MICROSCOPIC-ADD ON

## 2012-02-11 LAB — RETICULOCYTES: Retic Ct Pct: 0.6 % (ref 0.4–3.1)

## 2012-02-11 MED ORDER — SODIUM CHLORIDE 0.9 % IV BOLUS (SEPSIS)
1000.0000 mL | Freq: Once | INTRAVENOUS | Status: AC
  Administered 2012-02-11: 1000 mL via INTRAVENOUS

## 2012-02-11 MED ORDER — DIPHENHYDRAMINE HCL 25 MG PO CAPS
25.0000 mg | ORAL_CAPSULE | Freq: Once | ORAL | Status: AC
  Administered 2012-02-11: 25 mg via ORAL
  Filled 2012-02-11: qty 1

## 2012-02-11 MED ORDER — ONDANSETRON HCL 4 MG/2ML IJ SOLN
4.0000 mg | Freq: Once | INTRAMUSCULAR | Status: AC
  Administered 2012-02-11: 4 mg via INTRAVENOUS
  Filled 2012-02-11: qty 2

## 2012-02-11 MED ORDER — KETOROLAC TROMETHAMINE 30 MG/ML IJ SOLN
30.0000 mg | Freq: Once | INTRAMUSCULAR | Status: AC
  Administered 2012-02-11: 30 mg via INTRAVENOUS
  Filled 2012-02-11: qty 1

## 2012-02-11 MED ORDER — HYDROMORPHONE HCL PF 1 MG/ML IJ SOLN
1.0000 mg | Freq: Once | INTRAMUSCULAR | Status: DC
  Filled 2012-02-11: qty 1

## 2012-02-11 NOTE — ED Notes (Addendum)
Here for generalized pain - states all over body but mostly in right arm and leg. Feels weak. Some SOB. Denies chest pain/cough. Has felt feverish. Gives history of sickle cell trait

## 2012-02-11 NOTE — ED Notes (Signed)
Warm blanket given

## 2012-02-11 NOTE — ED Notes (Signed)
Patient transported to X-ray for a vascular study. 

## 2012-02-11 NOTE — ED Notes (Signed)
Pt resting, no needs at this time; family at bedside

## 2012-02-11 NOTE — ED Notes (Signed)
Patient transported to X-ray 

## 2012-02-11 NOTE — ED Notes (Signed)
Pt has returned from being out of the department, placed pt back on monitor, continuous pulse oximetry, blood pressure cuff and oxygen [place] (2L); family at bedside

## 2012-02-11 NOTE — Progress Notes (Signed)
Right:  No evidence of DVT, superficial thrombosis, or Baker's cyst.  Left:  Negative for DVT in the common femoral vein.  

## 2012-02-11 NOTE — ED Notes (Signed)
Bus pass provided per pt's request.

## 2012-02-11 NOTE — ED Provider Notes (Signed)
History     CSN: [id number]  Arrival date & time 02/11/12  7829   First MD Initiated Contact with Patient 02/11/12 [phone]      Chief Complaint  Patient presents with  . Extremity Pain    (Consider location/radiation/quality/duration/timing/severity/associated sxs/prior treatment) HPI Comments: 26 year old female with a past medical history of chronic ankle pain, asthma, and sickle cell trait presents to the emergency department with chief complaint of nausea , body aches, and right lower extremity pain.  Patient states that she has had some swelling behind the right knee for 3 days.  The patient is homeless and lives in an [place] [address].  She has to walk downtown every day.  She's had significant difficulty walking on the leg.  She denies any chest pain or shortness of breath.  She denies any history of thrombotic event, DVT or PE.  She is not currently taking any exogenous estrogens.  Patient is a smoker.  Patient has also had some nausea and several episodes of vomiting.  States that she had it she states that last night and was unable to keep it down.  He shouldn't feels that her body aches are related to her sickle cell trait because she lives outside in the cold weather.   Denies fevers, chills. Denies DOE, SOB, chest tightness or pressure, radiation to left arm, jaw or back, or diaphoresis. Denies dysuria, flank pain, suprapubic pain, frequency, urgency, or hematuria. Denies headaches, light headedness, weakness, visual disturbances.  The history is provided by the patient and medical records. No language interpreter was used.    Past Medical History  Diagnosis Date  . Scoliosis   . Sickle cell trait   . Hx of tubal ligation     History reviewed. No pertinent past surgical history.  History reviewed. No pertinent family history.  History  Substance Use Topics  . Smoking status: Current Some Day Smoker    Types: Cigarettes  . Smokeless tobacco: Not on file  .  Alcohol Use: No    OB History    Grav Para Term Preterm Abortions TAB SAB Ect Mult Living                  Review of Systems Ten systems reviewed and are negative for acute change, except as noted in the HPI.   Allergies  Other  Home Medications   Current Outpatient Rx  Name  Route  Sig  Dispense  Refill  . ALBUTEROL SULFATE HFA 108 (90 BASE) MCG/ACT IN AERS   Inhalation   Inhale 1-2 puffs into the lungs every 6 (six) hours as needed for wheezing.   1 Inhaler   0   . METOCLOPRAMIDE HCL 10 MG PO TABS   Oral   Take 10 mg by mouth every 6 (six) hours as needed. For nausea            BP 100/59  Pulse 51  Temp 98.2 F (36.8 C) (Oral)  Resp 17  SpO2 100%  LMP 01/16/2012  Physical Exam Physical Exam  Nursing note and vitals reviewed. Constitutional: She is oriented to person, place, and time. She appears well-developed and well-nourished. No distress.  HENT:  Head: Normocephalic and atraumatic.  Eyes: Conjunctivae normal and EOM are normal. Pupils are equal, round, and reactive to light. No scleral icterus.  Neck: Normal range of motion.  Cardiovascular: Normal rate, regular rhythm and normal heart sounds.  Exam reveals no gallop and no friction rub.   No murmur  heard. Pulmonary/Chest: Effort normal and breath sounds normal. No respiratory distress.  Abdominal: Soft. Bowel sounds are normal. She exhibits no distension and no mass. There is no tenderness. There is no guarding.  Musculoskeletal: There is some swelling to the posterior right popliteal fossa she is exquisitely tender to palpation of this area.  Distal pulses are good bilaterally.  Minimal swelling in the posterior calf on the right side.  There is no peripheral edema.   Neurological: She is alert and oriented to person, place, and time.  Skin: Skin is warm and dry. She is not diaphoretic.    ED Course  Procedures (including critical care time)  Labs Reviewed  POCT I-STAT, CHEM 8 - Abnormal; Notable  for the following:    BUN <3 (*)     All other components within normal limits  CBC  RETICULOCYTES   No results found.   1. Sickle-cell trait       MDM  8:18 AM BP 100/59  Pulse 51  Temp 98.2 F (36.8 C) (Oral)  Resp 17  SpO2 100%  LMP 01/16/2012 Patient with complicating social factors.  I suspect partially that she is very cold and seeking a place to be warm, however I cannot rule out DVT of the right lower leg.  Have ordered DVT study.  Her pain has been controlled with IV Toradol.  She is getting fluids.  Patient is currently bradycardic.  10:21 AM BP 109/47  Pulse 55  Temp 98.2 F (36.8 C) (Oral)  Resp 15  SpO2 100%  LMP 01/16/2012 Patient with negative DVT study.  She has no Baker's cyst.  Patient has been bradycardic and mildly hypotensive.  She has gotten 1 L of fluid and is receiving a second now.  UA is pending.  Patient is currently eating.  I'm sending her to CDU.  I have given report to PA Lauralee Evener who is agreed to assume care of the patient.  Plan to wait for pending UA.  1 more fluid bolus.   Recheck vitals.  Patient may have had some hypothermia contributing to her vitals as she has been sleeping in the cold all night. Will  Move to CDU.     Arthor Captain, PA-C 02/11/12 [phone]

## 2012-02-11 NOTE — ED Notes (Signed)
Pt SB 50s. Pulse 90 on review of previous chart - no chest pain or SOB. Harris, PA updated and Verbal order to hold on dilaudid for now.

## 2012-02-11 NOTE — ED Provider Notes (Signed)
In CDU for rehydration, improvement of blood pressure. She has been sleeping without complaint. Offered additional sandwiches, but patient reports she doesn't want anything "but french fries". IV hydration given, blood pressure 105 systolic. Review of records show patient's blood pressure usually low 100's systolic. She has been ambulatory. Appears to walk steadily. No falls, vomiting, complaint of pain.  Stable for discahrge.  Rodena Medin, PA-C 02/11/12 [phone]

## 2012-02-11 NOTE — ED Notes (Signed)
Pt resting quietly on her side, no complaints voiced at this time, partner at bedside. Bed low and locked, side rails up. Pt easily rousable.

## 2012-02-12 LAB — URINE CULTURE

## 2012-02-13 NOTE — ED Provider Notes (Signed)
Medical screening examination/treatment/procedure(s) were performed by non-physician practitioner and as supervising physician I was immediately available for consultation/collaboration.  Juliet Rude. Rubin Payor, MD 02/13/12 [phone]

## 2012-02-14 ENCOUNTER — Emergency Department (HOSPITAL_COMMUNITY)
Admission: EM | Admit: 2012-02-14 | Discharge: 2012-02-14 | Disposition: A | Discharge status: Home / Self Care | Payer: Medicaid Other | Attending: Emergency Medicine | Admitting: Emergency Medicine

## 2012-02-14 ENCOUNTER — Encounter (HOSPITAL_COMMUNITY): Payer: Self-pay | Admitting: *Deleted

## 2012-02-14 DIAGNOSIS — J45909 Unspecified asthma, uncomplicated: Secondary | ICD-10-CM | POA: Insufficient documentation

## 2012-02-14 DIAGNOSIS — Z79899 Other long term (current) drug therapy: Secondary | ICD-10-CM | POA: Insufficient documentation

## 2012-02-14 DIAGNOSIS — Z8739 Personal history of other diseases of the musculoskeletal system and connective tissue: Secondary | ICD-10-CM | POA: Insufficient documentation

## 2012-02-14 DIAGNOSIS — R112 Nausea with vomiting, unspecified: Secondary | ICD-10-CM | POA: Insufficient documentation

## 2012-02-14 DIAGNOSIS — F17200 Nicotine dependence, unspecified, uncomplicated: Secondary | ICD-10-CM | POA: Insufficient documentation

## 2012-02-14 DIAGNOSIS — Z862 Personal history of diseases of the blood and blood-forming organs and certain disorders involving the immune mechanism: Secondary | ICD-10-CM | POA: Insufficient documentation

## 2012-02-14 DIAGNOSIS — R509 Fever, unspecified: Secondary | ICD-10-CM | POA: Insufficient documentation

## 2012-02-14 DIAGNOSIS — Z8659 Personal history of other mental and behavioral disorders: Secondary | ICD-10-CM | POA: Insufficient documentation

## 2012-02-14 DIAGNOSIS — B9689 Other specified bacterial agents as the cause of diseases classified elsewhere: Secondary | ICD-10-CM

## 2012-02-14 DIAGNOSIS — A599 Trichomoniasis, unspecified: Secondary | ICD-10-CM

## 2012-02-14 DIAGNOSIS — N12 Tubulo-interstitial nephritis, not specified as acute or chronic: Secondary | ICD-10-CM

## 2012-02-14 DIAGNOSIS — Z3202 Encounter for pregnancy test, result negative: Secondary | ICD-10-CM | POA: Insufficient documentation

## 2012-02-14 DIAGNOSIS — N760 Acute vaginitis: Secondary | ICD-10-CM

## 2012-02-14 HISTORY — DX: Unspecified asthma, uncomplicated: J45.909

## 2012-02-14 HISTORY — DX: Sickle-cell disease without crisis: D57.1

## 2012-02-14 LAB — CBC WITH DIFFERENTIAL/PLATELET
Basophils Absolute: 0 10*3/uL (ref 0.0–0.1)
Basophils Relative: 0 % (ref 0–1)
Eosinophils Relative: 1 % (ref 0–5)
Lymphocytes Relative: 23 % (ref 12–46)
MCHC: 35 g/dL (ref 30.0–36.0)
MCV: 83.9 fL (ref 78.0–100.0)
Monocytes Absolute: 0.7 10*3/uL (ref 0.1–1.0)
Platelets: 286 10*3/uL (ref 150–400)
RDW: 14 % (ref 11.5–15.5)
WBC: 5.9 10*3/uL (ref 4.0–10.5)

## 2012-02-14 LAB — WET PREP, GENITAL

## 2012-02-14 LAB — URINALYSIS, ROUTINE W REFLEX MICROSCOPIC
Bilirubin Urine: NEGATIVE
Nitrite: NEGATIVE
Protein, ur: NEGATIVE mg/dL
Specific Gravity, Urine: 1.011 (ref 1.005–1.030)
Urobilinogen, UA: 0.2 mg/dL (ref 0.0–1.0)

## 2012-02-14 LAB — URINE MICROSCOPIC-ADD ON

## 2012-02-14 LAB — BASIC METABOLIC PANEL
CO2: 27 mEq/L (ref 19–32)
Calcium: 8.6 mg/dL (ref 8.4–10.5)
Creatinine, Ser: 0.82 mg/dL (ref 0.50–1.10)
GFR calc Af Amer: >90 mL/min (ref >90)
GFR calc non Af Amer: >90 mL/min (ref >90)
Sodium: 139 mEq/L (ref 135–145)

## 2012-02-14 LAB — LIPASE, BLOOD: Lipase: 19 U/L (ref 11–59)

## 2012-02-14 LAB — POCT PREGNANCY, URINE: Preg Test, Ur: NEGATIVE

## 2012-02-14 MED ORDER — METRONIDAZOLE 500 MG PO TABS: 500.0000 mg | ORAL_TABLET | Freq: Two times a day (BID) | ORAL | Status: DC

## 2012-02-14 MED ORDER — SODIUM CHLORIDE 0.9 % IV BOLUS (SEPSIS)
1000.0000 mL | Freq: Once | INTRAVENOUS | Status: AC
  Administered 2012-02-14: 1000 mL via INTRAVENOUS

## 2012-02-14 MED ORDER — ONDANSETRON HCL 4 MG/2ML IJ SOLN
4.0000 mg | Freq: Once | INTRAMUSCULAR | Status: AC
  Administered 2012-02-14: 4 mg via INTRAVENOUS
  Filled 2012-02-14: qty 2

## 2012-02-14 MED ORDER — MORPHINE SULFATE 4 MG/ML IJ SOLN
4.0000 mg | INTRAMUSCULAR | Status: DC | PRN
  Administered 2012-02-14: 4 mg via INTRAVENOUS
  Filled 2012-02-14: qty 1

## 2012-02-14 MED ORDER — DEXTROSE 5 % IV SOLN
1.0000 g | Freq: Once | INTRAVENOUS | Status: AC
  Administered 2012-02-14: 1 g via INTRAVENOUS
  Filled 2012-02-14: qty 10

## 2012-02-14 MED ORDER — CEPHALEXIN 500 MG PO CAPS: 1000.0000 mg | ORAL_CAPSULE | Freq: Two times a day (BID) | ORAL | Status: DC

## 2012-02-14 MED ORDER — METRONIDAZOLE IN NACL 5-0.79 MG/ML-% IV SOLN
500.0000 mg | Freq: Once | INTRAVENOUS | Status: AC
  Administered 2012-02-14: 500 mg via INTRAVENOUS
  Filled 2012-02-14: qty 100

## 2012-02-14 MED ORDER — ONDANSETRON HCL 4 MG PO TABS: 4.0000 mg | ORAL_TABLET | Freq: Four times a day (QID) | ORAL | Status: DC

## 2012-02-14 MED ORDER — AZITHROMYCIN 250 MG PO TABS: 500.0000 mg | ORAL_TABLET | Freq: Every day | ORAL | Status: DC

## 2012-02-14 MED ORDER — HYDROCODONE-ACETAMINOPHEN 5-500 MG PO TABS: 1.0000 | ORAL_TABLET | Freq: Four times a day (QID) | ORAL | Status: DC | PRN

## 2012-02-14 MED ORDER — SODIUM CHLORIDE 0.9 % IV SOLN
Freq: Once | INTRAVENOUS | Status: AC
  Administered 2012-02-14: 22:00:00 via INTRAVENOUS

## 2012-02-14 MED ORDER — DEXTROSE 5 % IV SOLN
500.0000 mg | Freq: Once | INTRAVENOUS | Status: AC
  Administered 2012-02-14: 500 mg via INTRAVENOUS
  Filled 2012-02-14: qty 500

## 2012-02-14 NOTE — ED Provider Notes (Signed)
History     CSN: [id number]  Arrival date & time 02/14/12  1427   First MD Initiated Contact with Patient 02/14/12 2048      Chief Complaint  Patient presents with  . Flank Pain  . Nausea  . Emesis    (Consider location/radiation/quality/duration/timing/severity/associated sxs/prior treatment) HPI  [name] is a 26 y.o. female complaining of left flank pain, subjective fever and nausea vomiting worsening over the course of 2 days. Patient denies any dysuria, frequency, vaginal discharge, abdominal pain or change in bowel habits. No prior episodes. He states she is tolerating by mouth now. Pain is described as severe, 7/10. Cannot identify any exacerbating factors. She has been taking Motrin with very mild relief.  Past Medical History  Diagnosis Date  . Asthma   . Sickle cell anemia     Past Surgical History  Procedure Date  . Tubal ligation     History reviewed. No pertinent family history.  History  Substance Use Topics  . Smoking status: Current Every Day Smoker  . Smokeless tobacco: Never Used  . Alcohol Use: No    OB History    Grav Para Term Preterm Abortions TAB SAB Ect Mult Living                  Review of Systems  Constitutional: Positive for fever.  Respiratory: Negative for shortness of breath.   Cardiovascular: Negative for chest pain.  Gastrointestinal: Positive for nausea and vomiting. Negative for abdominal pain and diarrhea.  Genitourinary: Positive for flank pain. Negative for dysuria, frequency, vaginal discharge and genital sores.  All other systems reviewed and are negative.    Allergies  Tomato  Home Medications   Current Outpatient Rx  Name  Route  Sig  Dispense  Refill  . ALBUTEROL SULFATE HFA 108 (90 BASE) MCG/ACT IN AERS   Inhalation   Inhale 2 puffs into the lungs every 6 (six) hours as needed. Wheezing         . OXYCODONE HCL 5 MG PO TABS   Oral   Take 5 mg by mouth every 4 (four) hours as needed. Pain           . PRESCRIPTION MEDICATION      Pt states that she was on a antibiotic however she does not know the name of this medication and i cant find this filled at her pharmacy either. Pt states she has taken 2 doses.         . TRAMADOL HCL 50 MG PO TABS   Oral   Take 50 mg by mouth every 6 (six) hours as needed.           BP 111/63  Pulse 91  Temp 99.4 F (37.4 C) (Oral)  SpO2 99%  LMP 12/31/2011  Physical Exam  Nursing note and vitals reviewed. Constitutional: She is oriented to person, place, and time. She appears well-developed and well-nourished. No distress.  HENT:  Head: Normocephalic.  Mouth/Throat: Oropharynx is clear and moist.  Eyes: Conjunctivae normal and EOM are normal. Pupils are equal, round, and reactive to light.  Neck: Normal range of motion.  Cardiovascular: Normal rate, regular rhythm and normal heart sounds.   Pulmonary/Chest: Effort normal and breath sounds normal. No stridor. No respiratory distress. She has no wheezes. She has no rales. She exhibits no tenderness.  Abdominal: Soft. Bowel sounds are normal. She exhibits no distension and no mass. There is no tenderness. There is no rebound and no guarding.  Genitourinary: Cervix exhibits no motion tenderness. Right adnexum displays no tenderness. Left adnexum displays no tenderness.       Moderate right CVA tenderness.  Pelvic exam chaperoned by technician. No cervical motion tenderness, or bilateral adnexal tenderness.  Musculoskeletal: Normal range of motion.  Neurological: She is alert and oriented to person, place, and time.  Psychiatric: She has a normal mood and affect.    ED Course  Procedures (including critical care time)  Labs Reviewed  URINALYSIS, ROUTINE W REFLEX MICROSCOPIC - Abnormal; Notable for the following:    APPearance CLOUDY (*)     pH 8.5 (*)     Leukocytes, UA TRACE (*)     All other components within normal limits  BASIC METABOLIC PANEL - Abnormal; Notable for the  following:    BUN 5 (*)     All other components within normal limits  URINE MICROSCOPIC-ADD ON - Abnormal; Notable for the following:    Squamous Epithelial / LPF FEW (*)     Bacteria, UA FEW (*)     All other components within normal limits  POCT PREGNANCY, URINE  CBC WITH DIFFERENTIAL  LIPASE, BLOOD  URINE CULTURE   No results found.   1. Pyelonephritis   2. Bacterial vaginosis   3. Trichomoniasis       MDM  Likely pyelonephritis, however urinalysis is suspicious for a UTI.  Will treat her with a gram of Rocephin via IV. Urinalysis also shows Trichomonas. Pelvic exam will be performed and patient will be given IV Flagyl and azithromycin.  Wet prep shows Trichomonas and clue cells, also white blood cells. We'll treat her presumptively for gonorrhea and Chlamydia and DC her with Flagyl for BV and trichomonas and Keflex for her UTI  Discussed case with attending who agrees with plan and stability to d/c to home.    Pt verbalized understanding and agrees with care plan. Outpatient follow-up and return precautions given.    New Prescriptions   CEPHALEXIN (KEFLEX) 500 MG CAPSULE    Take 2 capsules (1,000 mg total) by mouth 2 (two) times daily.   HYDROCODONE-ACETAMINOPHEN (VICODIN) 5-500 MG PER TABLET    Take 1-2 tablets by mouth every 6 (six) hours as needed for pain.   METRONIDAZOLE (FLAGYL) 500 MG TABLET    Take 1 tablet (500 mg total) by mouth 2 (two) times daily. One po bid x 7 days   ONDANSETRON (ZOFRAN) 4 MG TABLET    Take 1 tablet (4 mg total) by mouth every 6 (six) hours.           Wynetta Emery, PA-C 02/14/12 2329

## 2012-02-14 NOTE — ED Notes (Signed)
Pt states the past 2 days she's had R flank pain, nausea, and vomiting, denies diarrhea, denies urinary symptoms.

## 2012-02-14 NOTE — ED Notes (Signed)
Per EMS pt having R flank pain x 2-3 days, and "haven't been able to keep down anything", nausea, vomiting, pt has prescription for zofran. BP 122/60, HR 96, RR 16.

## 2012-02-14 NOTE — ED Notes (Signed)
Offered sprite per pt's preference--- tolerating well, states "I'm still a little bit nauseous".

## 2012-02-15 ENCOUNTER — Encounter (HOSPITAL_COMMUNITY): Payer: Self-pay | Admitting: *Deleted

## 2012-02-15 LAB — URINE CULTURE: Colony Count: 30000

## 2012-02-15 LAB — GC/CHLAMYDIA PROBE AMP
CT Probe RNA: NEGATIVE
GC Probe RNA: NEGATIVE

## 2012-02-15 NOTE — ED Provider Notes (Signed)
Medical screening examination/treatment/procedure(s) were performed by non-physician practitioner and as supervising physician I was immediately available for consultation/collaboration.  Hurman Horn, MD 02/15/12 [phone]

## 2012-02-19 ENCOUNTER — Encounter (HOSPITAL_COMMUNITY): Payer: Self-pay | Admitting: Emergency Medicine

## 2012-02-19 ENCOUNTER — Emergency Department (HOSPITAL_COMMUNITY)
Admission: EM | Admit: 2012-02-19 | Discharge: 2012-02-19 | Disposition: A | Discharge status: Home / Self Care | Payer: Medicaid Other | Attending: Emergency Medicine | Admitting: Emergency Medicine

## 2012-02-19 DIAGNOSIS — R5381 Other malaise: Secondary | ICD-10-CM | POA: Insufficient documentation

## 2012-02-19 DIAGNOSIS — M4120 Other idiopathic scoliosis, site unspecified: Secondary | ICD-10-CM | POA: Insufficient documentation

## 2012-02-19 DIAGNOSIS — R109 Unspecified abdominal pain: Secondary | ICD-10-CM | POA: Insufficient documentation

## 2012-02-19 DIAGNOSIS — Z9851 Tubal ligation status: Secondary | ICD-10-CM | POA: Insufficient documentation

## 2012-02-19 DIAGNOSIS — Z8659 Personal history of other mental and behavioral disorders: Secondary | ICD-10-CM | POA: Insufficient documentation

## 2012-02-19 DIAGNOSIS — Z5900 Homelessness unspecified: Secondary | ICD-10-CM | POA: Insufficient documentation

## 2012-02-19 DIAGNOSIS — Z79899 Other long term (current) drug therapy: Secondary | ICD-10-CM | POA: Insufficient documentation

## 2012-02-19 DIAGNOSIS — D573 Sickle-cell trait: Secondary | ICD-10-CM | POA: Insufficient documentation

## 2012-02-19 DIAGNOSIS — J45909 Unspecified asthma, uncomplicated: Secondary | ICD-10-CM | POA: Insufficient documentation

## 2012-02-19 DIAGNOSIS — F17200 Nicotine dependence, unspecified, uncomplicated: Secondary | ICD-10-CM | POA: Insufficient documentation

## 2012-02-19 DIAGNOSIS — M25561 Pain in right knee: Secondary | ICD-10-CM

## 2012-02-19 DIAGNOSIS — M25569 Pain in unspecified knee: Secondary | ICD-10-CM | POA: Insufficient documentation

## 2012-02-19 DIAGNOSIS — R5383 Other fatigue: Secondary | ICD-10-CM

## 2012-02-19 HISTORY — DX: Bipolar disorder, unspecified: F31.9

## 2012-02-19 LAB — COMPREHENSIVE METABOLIC PANEL
ALT: 11 U/L (ref 0–35)
CO2: 29 mEq/L (ref 19–32)
Calcium: 8.5 mg/dL (ref 8.4–10.5)
Creatinine, Ser: 0.66 mg/dL (ref 0.50–1.10)
GFR calc Af Amer: >90 mL/min (ref >90)
GFR calc non Af Amer: >90 mL/min (ref >90)
Glucose, Bld: 86 mg/dL (ref 70–99)
Sodium: 140 mEq/L (ref 135–145)
Total Protein: 6.6 g/dL (ref 6.0–8.3)

## 2012-02-19 LAB — URINALYSIS, ROUTINE W REFLEX MICROSCOPIC
Leukocytes, UA: NEGATIVE
Nitrite: NEGATIVE
Specific Gravity, Urine: 1.017 (ref 1.005–1.030)
Urobilinogen, UA: 1 mg/dL (ref 0.0–1.0)
pH: 8 (ref 5.0–8.0)

## 2012-02-19 LAB — URINE MICROSCOPIC-ADD ON

## 2012-02-19 LAB — CBC WITH DIFFERENTIAL/PLATELET
Basophils Absolute: 0 K/uL (ref 0.0–0.1)
Basophils Relative: 1 % (ref 0–1)
Eosinophils Absolute: 0 10*3/uL (ref 0.0–0.7)
Eosinophils Relative: 1 % (ref 0–5)
HCT: 39.9 % (ref 36.0–46.0)
Hemoglobin: 13.7 g/dL (ref 12.0–15.0)
Lymphocytes Relative: 43 % (ref 12–46)
Lymphs Abs: 2.1 10*3/uL (ref 0.7–4.0)
MCH: 29 pg (ref 26.0–34.0)
MCHC: 34.3 g/dL (ref 30.0–36.0)
MCV: 84.4 fL (ref 78.0–100.0)
Monocytes Absolute: 0.4 10*3/uL (ref 0.1–1.0)
Monocytes Relative: 8 % (ref 3–12)
Neutro Abs: 2.3 K/uL (ref 1.7–7.7)
Neutrophils Relative %: 47 % (ref 43–77)
Platelets: 276 10*3/uL (ref 150–400)
RBC: 4.73 MIL/uL (ref 3.87–5.11)
RDW: 14.1 % (ref 11.5–15.5)
WBC: 4.8 K/uL (ref 4.0–10.5)

## 2012-02-19 LAB — COMPREHENSIVE METABOLIC PANEL WITH GFR
AST: 22 U/L (ref 0–37)
Albumin: 3.2 g/dL — ABNORMAL LOW (ref 3.5–5.2)
Alkaline Phosphatase: 64 U/L (ref 39–117)
BUN: 6 mg/dL (ref 6–23)
Chloride: 104 meq/L (ref 96–112)
Potassium: 3.7 meq/L (ref 3.5–5.1)
Total Bilirubin: 0.2 mg/dL — ABNORMAL LOW (ref 0.3–1.2)

## 2012-02-19 MED ORDER — IBUPROFEN 800 MG PO TABS
800.0000 mg | ORAL_TABLET | Freq: Once | ORAL | Status: AC
  Administered 2012-02-19: 800 mg via ORAL
  Filled 2012-02-19: qty 1

## 2012-02-19 MED ORDER — OXYCODONE-ACETAMINOPHEN 5-325 MG PO TABS
1.0000 | ORAL_TABLET | Freq: Once | ORAL | Status: AC
  Administered 2012-02-19: 1 via ORAL
  Filled 2012-02-19: qty 1

## 2012-02-19 NOTE — ED Notes (Addendum)
Pt BP 86/46 MD Ghim and PA notified. Verbalized that BP is pts norm, no concerned. Pt informed, verbalized understanding.

## 2012-02-19 NOTE — ED Notes (Signed)
Pt informed CSW that she does not desire to go to the shelter in [place] at this time and that she would prefer to try [facility] again upon discharge. CSW provided pt with bus pass to be transported to [facility]. CSW signing off.   Janann Colonel., MSW, [facility] Weekend ED Clinical Social Worker [phone]

## 2012-02-19 NOTE — ED Notes (Signed)
CSW met with pt by bedside per request of RN to provide shelter information and assistance. Pt reported that [facility] will not allow her to come back because she has been there within 6 months. Pt stated that she is on the waitlist for [facility] shelter at this time as well. CSW contacted surrounding shelters in the area to determine if any female beds are available. [facility] in [place] reported to CSW that beds are availablity at 7pm and that it is first come, first serve service. CSW informed pt of bed availability in [place], [place]. Pt requested that CSW give 5 minutes to process and decide if she can proceed with plan to seek shelter in [place], [place]. CSW will remain available to pt to provide assistance.   Janann Colonel., MSW, [facility] Weekend ED Clinical Social Worker [phone]

## 2012-02-19 NOTE — ED Notes (Signed)
Greg Social Work at bedside.

## 2012-02-19 NOTE — ED Provider Notes (Signed)
Medical screening examination/treatment/procedure(s) were performed by non-physician practitioner and as supervising physician I was immediately available for consultation/collaboration.   Gavin Pound. Dyasia Firestine, MD 02/19/12 1413

## 2012-02-19 NOTE — ED Provider Notes (Signed)
History     CSN: [id number]  Arrival date & time 02/19/12  1158   First MD Initiated Contact with Patient 02/19/12 1230      Chief Complaint  Patient presents with  . Fatigue  . Flank Pain    right    (Consider location/radiation/quality/duration/timing/severity/associated sxs/prior treatment) HPI  Thessaly I Josten is a 27 y.o. female complaining of generalized weakness and right thigh and knee pain worsening over the course of 2 days. Patient states she has difficulty walking because her right knee locks up and gives out. She denies trauma, numbness paresthesia, chest pain, shortness of breath, palpitations, abdominal pain, nausea vomiting, change in bowel or bladder habits. Patient has recently become homeless.  Past Medical History  Diagnosis Date  . Scoliosis   . Sickle cell trait   . Hx of tubal ligation   . Asthma   . Sickle cell anemia   . Bipolar disorder     Past Surgical History  Procedure Date  . Tubal ligation     History reviewed. No pertinent family history.  History  Substance Use Topics  . Smoking status: Current Every Day Smoker  . Smokeless tobacco: Never Used  . Alcohol Use: No    OB History    Grav Para Term Preterm Abortions TAB SAB Ect Mult Living                  Review of Systems  Constitutional: Positive for fatigue. Negative for fever.  Respiratory: Negative for shortness of breath.   Cardiovascular: Negative for chest pain.  Gastrointestinal: Negative for nausea, vomiting, abdominal pain and diarrhea.  Musculoskeletal: Positive for arthralgias.  All other systems reviewed and are negative.    Allergies  Other and Tomato  Home Medications   Current Outpatient Rx  Name  Route  Sig  Dispense  Refill  . ALBUTEROL SULFATE HFA 108 (90 BASE) MCG/ACT IN AERS   Inhalation   Inhale 2 puffs into the lungs every 6 (six) hours as needed. Wheezing         . CEPHALEXIN 500 MG PO CAPS   Oral   Take 1,000 mg by mouth 2 (two) times  daily.         . IBUPROFEN 200 MG PO TABS   Oral   Take 200 mg by mouth every 6 (six) hours as needed. Pain         . METOCLOPRAMIDE HCL 10 MG PO TABS   Oral   Take 10 mg by mouth every 6 (six) hours as needed. For nausea         . METRONIDAZOLE 500 MG PO TABS   Oral   Take 500 mg by mouth 2 (two) times daily. One po bid x 7 days         . ONDANSETRON HCL 4 MG PO TABS   Oral   Take 4 mg by mouth every 6 (six) hours.         . OXYCODONE HCL 5 MG PO TABS   Oral   Take 5 mg by mouth every 4 (four) hours as needed. Pain         . TRAMADOL HCL 50 MG PO TABS   Oral   Take 50 mg by mouth every 6 (six) hours as needed. Pain           BP 99/59  Pulse 72  Temp 98.2 F (36.8 C) (Oral)  Resp 20  Ht 5\' 5"  (1.651 m)  Wt  150 lb (68.04 kg)  BMI 24.96 kg/m2  SpO2 99%  LMP 12/31/2011  Physical Exam  Nursing note and vitals reviewed. Constitutional: She is oriented to person, place, and time. She appears well-developed and well-nourished. No distress.  HENT:  Head: Normocephalic.  Mouth/Throat: Oropharynx is clear and moist.       Palpebral conjunctiva are ruddy  Eyes: Conjunctivae normal and EOM are normal. Pupils are equal, round, and reactive to light.  Cardiovascular: Normal rate, regular rhythm and intact distal pulses.   Pulmonary/Chest: Effort normal and breath sounds normal. No stridor. No respiratory distress. She has no wheezes. She has no rales. She exhibits no tenderness.  Abdominal: Soft. Bowel sounds are normal.  Musculoskeletal: Normal range of motion.       Right knee:  No deformity, erythema or abrasions. FROM. No effusion or crepitance. Anterior and posterior drawer show no abnormal laxity. Stable to valgus and varus stress. Joint lines are non-tender. Neurovascularly intact. Pt ambulates with non-antalgic gait.   Neurological: She is alert and oriented to person, place, and time.  Psychiatric: She has a normal mood and affect.    ED Course    Procedures (including critical care time)  Labs Reviewed  COMPREHENSIVE METABOLIC PANEL - Abnormal; Notable for the following:    Albumin 3.2 (*)     Total Bilirubin 0.2 (*)     All other components within normal limits  URINALYSIS, ROUTINE W REFLEX MICROSCOPIC - Abnormal; Notable for the following:    Hgb urine dipstick MODERATE (*)     All other components within normal limits  CBC WITH DIFFERENTIAL  URINE MICROSCOPIC-ADD ON   No results found.   1. Arthralgia of knee, right   2. Homelessness   3. Fatigue       MDM  Physical exam is within normal limits. Patient ambulates without difficulty. She is tolerating by mouth. Blood work and urinalysis are normal. There is hematuria this is secondary to active menstruation.  Vital signs are stable and she is amenable to discharge at this time.   Pt verbalized understanding and agrees with care plan. Outpatient follow-up and return precautions given.           Wynetta Emery, PA-C 02/19/12 1410

## 2012-02-19 NOTE — ED Notes (Signed)
EMS reports patient c/o generalized weakness and right sided flank pain x 2 days.  Patient also reports getting evicted two days ago and has nowhere to live.  Patient also has h/o bipolar d/o.

## 2012-02-19 NOTE — ED Notes (Signed)
Pt wants to speak with social work concerning shelter. States that she is homeless and needs a place to go. Social work contacted.

## 2012-05-09 ENCOUNTER — Encounter (HOSPITAL_COMMUNITY): Payer: Self-pay | Admitting: Emergency Medicine

## 2012-05-09 ENCOUNTER — Emergency Department (HOSPITAL_COMMUNITY)
Admission: EM | Admit: 2012-05-09 | Discharge: 2012-05-09 | Disposition: A | Discharge status: Home / Self Care | Payer: Medicaid Other | Attending: Emergency Medicine | Admitting: Emergency Medicine

## 2012-05-09 DIAGNOSIS — Z8739 Personal history of other diseases of the musculoskeletal system and connective tissue: Secondary | ICD-10-CM | POA: Insufficient documentation

## 2012-05-09 DIAGNOSIS — F17200 Nicotine dependence, unspecified, uncomplicated: Secondary | ICD-10-CM | POA: Insufficient documentation

## 2012-05-09 DIAGNOSIS — Z9851 Tubal ligation status: Secondary | ICD-10-CM | POA: Insufficient documentation

## 2012-05-09 DIAGNOSIS — N390 Urinary tract infection, site not specified: Secondary | ICD-10-CM

## 2012-05-09 DIAGNOSIS — F411 Generalized anxiety disorder: Secondary | ICD-10-CM | POA: Insufficient documentation

## 2012-05-09 DIAGNOSIS — Z3202 Encounter for pregnancy test, result negative: Secondary | ICD-10-CM | POA: Insufficient documentation

## 2012-05-09 DIAGNOSIS — Z862 Personal history of diseases of the blood and blood-forming organs and certain disorders involving the immune mechanism: Secondary | ICD-10-CM | POA: Insufficient documentation

## 2012-05-09 DIAGNOSIS — R109 Unspecified abdominal pain: Secondary | ICD-10-CM | POA: Insufficient documentation

## 2012-05-09 DIAGNOSIS — Z79899 Other long term (current) drug therapy: Secondary | ICD-10-CM | POA: Insufficient documentation

## 2012-05-09 DIAGNOSIS — Z8659 Personal history of other mental and behavioral disorders: Secondary | ICD-10-CM | POA: Insufficient documentation

## 2012-05-09 DIAGNOSIS — N898 Other specified noninflammatory disorders of vagina: Secondary | ICD-10-CM | POA: Insufficient documentation

## 2012-05-09 DIAGNOSIS — R112 Nausea with vomiting, unspecified: Secondary | ICD-10-CM | POA: Insufficient documentation

## 2012-05-09 DIAGNOSIS — J45909 Unspecified asthma, uncomplicated: Secondary | ICD-10-CM | POA: Insufficient documentation

## 2012-05-09 LAB — URINALYSIS, ROUTINE W REFLEX MICROSCOPIC
Bilirubin Urine: NEGATIVE
Glucose, UA: NEGATIVE mg/dL
Ketones, ur: 40 mg/dL — AB
Protein, ur: NEGATIVE mg/dL
Urobilinogen, UA: 1 mg/dL (ref 0.0–1.0)

## 2012-05-09 LAB — URINE MICROSCOPIC-ADD ON

## 2012-05-09 LAB — POCT PREGNANCY, URINE: Preg Test, Ur: NEGATIVE

## 2012-05-09 MED ORDER — DEXTROSE 5 % IV SOLN
1.0000 g | Freq: Once | INTRAVENOUS | Status: AC
  Administered 2012-05-09: 1 g via INTRAVENOUS
  Filled 2012-05-09: qty 10

## 2012-05-09 MED ORDER — ONDANSETRON HCL 4 MG/2ML IJ SOLN
4.0000 mg | Freq: Once | INTRAMUSCULAR | Status: AC
  Administered 2012-05-09: 4 mg via INTRAVENOUS
  Filled 2012-05-09: qty 2

## 2012-05-09 MED ORDER — ONDANSETRON HCL 4 MG PO TABS: 4.0000 mg | ORAL_TABLET | Freq: Four times a day (QID) | ORAL | Status: DC

## 2012-05-09 MED ORDER — KETOROLAC TROMETHAMINE 30 MG/ML IJ SOLN
30.0000 mg | Freq: Once | INTRAMUSCULAR | Status: AC
Start: 2012-05-09 — End: 2012-05-09
  Administered 2012-05-09: 30 mg via INTRAVENOUS
  Filled 2012-05-09: qty 1

## 2012-05-09 MED ORDER — SODIUM CHLORIDE 0.9 % IV SOLN
Freq: Once | INTRAVENOUS | Status: AC
  Administered 2012-05-09: 02:00:00 via INTRAVENOUS

## 2012-05-09 MED ORDER — SODIUM CHLORIDE 0.9 % IV BOLUS (SEPSIS)
1000.0000 mL | Freq: Once | INTRAVENOUS | Status: AC
  Administered 2012-05-09: 1000 mL via INTRAVENOUS

## 2012-05-09 MED ORDER — CEPHALEXIN 500 MG PO CAPS: 500.0000 mg | ORAL_CAPSULE | Freq: Four times a day (QID) | ORAL | Status: DC

## 2012-05-09 NOTE — ED Provider Notes (Signed)
History     CSN: [id number]  Arrival date & time 05/09/12  0001   First MD Initiated Contact with Patient 05/09/12 0158      Chief Complaint  Patient presents with  . Emesis  . Abdominal Pain  . Anxiety  . Vaginal Bleeding    (Consider location/radiation/quality/duration/timing/severity/associated sxs/prior treatment) HPI Hx per PT. N/V and ABD pain onset tonight, no diarrhea, ne fevers, no back pain. No rash,. No recent ABx. No blood in emesis, multiple episodes of emesis tonight. Mod in severity. Some ongoing vaginal spotting - has scheduled f/u with her GYN. No syncope, no lightheadedness '  Past Medical History  Diagnosis Date  . Scoliosis   . Sickle cell trait   . Hx of tubal ligation   . Asthma   . Sickle cell anemia   . Bipolar disorder     Past Surgical History  Procedure Laterality Date  . Tubal ligation      History reviewed. No pertinent family history.  History  Substance Use Topics  . Smoking status: Current Every Day Smoker  . Smokeless tobacco: Never Used  . Alcohol Use: No    OB History   Grav Para Term Preterm Abortions TAB SAB Ect Mult Living                  Review of Systems  Constitutional: Negative for fever and chills.  HENT: Negative for neck pain and neck stiffness.   Eyes: Negative for pain.  Respiratory: Negative for shortness of breath.   Cardiovascular: Negative for chest pain.  Gastrointestinal: Positive for nausea, vomiting and abdominal pain.  Genitourinary: Negative for dysuria.  Musculoskeletal: Negative for back pain.  Skin: Negative for rash.  Neurological: Negative for headaches.  All other systems reviewed and are negative.    Allergies  Other and Tomato  Home Medications   Current Outpatient Rx  Name  Route  Sig  Dispense  Refill  . albuterol (PROVENTIL HFA;VENTOLIN HFA) 108 (90 BASE) MCG/ACT inhaler   Inhalation   Inhale 2 puffs into the lungs every 6 (six) hours as needed. Wheezing         .  ibuprofen (ADVIL,MOTRIN) 200 MG tablet   Oral   Take 200 mg by mouth every 6 (six) hours as needed. Pain         . traMADol (ULTRAM) 50 MG tablet   Oral   Take 50 mg by mouth every 6 (six) hours as needed. Pain           BP 103/55  Pulse 72  Temp(Src) 98 F (36.7 C) (Oral)  Resp 18  SpO2 100%  Physical Exam  Constitutional: She is oriented to person, place, and time. She appears well-developed and well-nourished.  HENT:  Head: Normocephalic and atraumatic.  Mouth/Throat: No oropharyngeal exudate.  Dry mm  Eyes: Conjunctivae and EOM are normal. Pupils are equal, round, and reactive to light.  Neck: Neck supple.  Cardiovascular: Regular rhythm and intact distal pulses.   Pulmonary/Chest: Effort normal. No respiratory distress.  Abdominal: Soft. Bowel sounds are normal. She exhibits no distension. There is no tenderness. There is no rebound.  No CVAT  Musculoskeletal: Normal range of motion. She exhibits no edema.  Neurological: She is alert and oriented to person, place, and time.  Skin: Skin is warm and dry.    ED Course  Procedures (including critical care time)  Results for orders placed during the hospital encounter of 05/09/12  URINALYSIS, ROUTINE W REFLEX MICROSCOPIC  Result Value Range   Color, Urine YELLOW  YELLOW   APPearance CLOUDY (*) CLEAR   Specific Gravity, Urine 1.023  1.005 - 1.030   pH 6.0  5.0 - 8.0   Glucose, UA NEGATIVE  NEGATIVE mg/dL   Hgb urine dipstick LARGE (*) NEGATIVE   Bilirubin Urine NEGATIVE  NEGATIVE   Ketones, ur 40 (*) NEGATIVE mg/dL   Protein, ur NEGATIVE  NEGATIVE mg/dL   Urobilinogen, UA 1.0  0.0 - 1.0 mg/dL   Nitrite POSITIVE (*) NEGATIVE   Leukocytes, UA MODERATE (*) NEGATIVE  URINE MICROSCOPIC-ADD ON      Result Value Range   Squamous Epithelial / LPF RARE  RARE   WBC, UA 11-20  <3 WBC/hpf   RBC / HPF 11-20  <3 RBC/hpf   Bacteria, UA MANY (*) RARE  POCT PREGNANCY, URINE      Result Value Range   Preg Test, Ur  NEGATIVE  NEGATIVE   IVFs. IV toradol. IV zofran  2:09 AM nausea improved requesting POs. IV ABx for UTI  4:39 AM doing much better and requesting to be discharged home. Drinking water and needing crackers with no return of nausea or vomiting.  MDM  Nausea vomiting with UTI as above. Urine culture pending. IV fluids. IV antibiotics. IV Zofran. IV Toradol  Condition improved - has scheduled outpatient followup  UTI, abdominal pain and vomiting precautions provided  Vital signs and nursing notes reviewed and considered        Sunnie Nielsen, MD 05/09/12 [phone]
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
## 2012-05-10 LAB — URINE CULTURE

## 2012-05-11 ENCOUNTER — Telehealth (HOSPITAL_COMMUNITY): Payer: Self-pay | Admitting: Emergency Medicine

## 2012-05-11 NOTE — ED Notes (Signed)
Positive urnc- treated per protocol. No further follow up at this time.  

## 2012-07-17 ENCOUNTER — Encounter (HOSPITAL_COMMUNITY): Payer: Self-pay

## 2012-07-17 ENCOUNTER — Emergency Department (HOSPITAL_COMMUNITY)
Admission: EM | Admit: 2012-07-17 | Discharge: 2012-07-18 | Disposition: A | Discharge status: Home / Self Care | Payer: Medicaid Other | Attending: Emergency Medicine | Admitting: Emergency Medicine

## 2012-07-17 DIAGNOSIS — R42 Dizziness and giddiness: Secondary | ICD-10-CM | POA: Insufficient documentation

## 2012-07-17 DIAGNOSIS — J45909 Unspecified asthma, uncomplicated: Secondary | ICD-10-CM | POA: Insufficient documentation

## 2012-07-17 DIAGNOSIS — S0083XA Contusion of other part of head, initial encounter: Secondary | ICD-10-CM

## 2012-07-17 DIAGNOSIS — S0003XA Contusion of scalp, initial encounter: Secondary | ICD-10-CM | POA: Insufficient documentation

## 2012-07-17 DIAGNOSIS — S1093XA Contusion of unspecified part of neck, initial encounter: Secondary | ICD-10-CM | POA: Insufficient documentation

## 2012-07-17 DIAGNOSIS — Z79899 Other long term (current) drug therapy: Secondary | ICD-10-CM | POA: Insufficient documentation

## 2012-07-17 DIAGNOSIS — Z8659 Personal history of other mental and behavioral disorders: Secondary | ICD-10-CM | POA: Insufficient documentation

## 2012-07-17 DIAGNOSIS — Z862 Personal history of diseases of the blood and blood-forming organs and certain disorders involving the immune mechanism: Secondary | ICD-10-CM | POA: Insufficient documentation

## 2012-07-17 DIAGNOSIS — Z9851 Tubal ligation status: Secondary | ICD-10-CM | POA: Insufficient documentation

## 2012-07-17 DIAGNOSIS — F17200 Nicotine dependence, unspecified, uncomplicated: Secondary | ICD-10-CM | POA: Insufficient documentation

## 2012-07-17 DIAGNOSIS — Z8739 Personal history of other diseases of the musculoskeletal system and connective tissue: Secondary | ICD-10-CM | POA: Insufficient documentation

## 2012-07-17 HISTORY — DX: Type 2 diabetes mellitus without complications: E11.9
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
## 2012-07-18 ENCOUNTER — Encounter (HOSPITAL_COMMUNITY): Payer: Self-pay

## 2012-07-18 MED ORDER — IBUPROFEN 200 MG PO TABS
600.0000 mg | ORAL_TABLET | Freq: Once | ORAL | Status: AC
  Administered 2012-07-18: 600 mg via ORAL
  Filled 2012-07-18: qty 3

## 2012-07-18 MED ORDER — IBUPROFEN 600 MG PO TABS: 600.0000 mg | ORAL_TABLET | Freq: Three times a day (TID) | ORAL | Status: DC | PRN

## 2012-07-18 NOTE — ED Provider Notes (Signed)
Medical screening examination/treatment/procedure(s) were performed by non-physician practitioner and as supervising physician I was immediately available for consultation/collaboration.  Mehlani Blankenburg K Alek Poncedeleon-Rasch, MD 07/18/12 0215 
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
## 2012-08-21 ENCOUNTER — Emergency Department: Payer: Self-pay | Admitting: Emergency Medicine

## 2012-08-24 ENCOUNTER — Emergency Department (HOSPITAL_COMMUNITY)
Admission: EM | Admit: 2012-08-24 | Discharge: 2012-08-24 | Disposition: A | Discharge status: Home / Self Care | Payer: Medicaid Other | Attending: Emergency Medicine | Admitting: Emergency Medicine

## 2012-08-24 ENCOUNTER — Encounter (HOSPITAL_COMMUNITY): Payer: Self-pay

## 2012-08-24 ENCOUNTER — Emergency Department (HOSPITAL_COMMUNITY): Payer: Medicaid Other

## 2012-08-24 DIAGNOSIS — R109 Unspecified abdominal pain: Secondary | ICD-10-CM | POA: Insufficient documentation

## 2012-08-24 DIAGNOSIS — Z8659 Personal history of other mental and behavioral disorders: Secondary | ICD-10-CM | POA: Insufficient documentation

## 2012-08-24 DIAGNOSIS — R079 Chest pain, unspecified: Secondary | ICD-10-CM | POA: Insufficient documentation

## 2012-08-24 DIAGNOSIS — E119 Type 2 diabetes mellitus without complications: Secondary | ICD-10-CM | POA: Insufficient documentation

## 2012-08-24 DIAGNOSIS — J45909 Unspecified asthma, uncomplicated: Secondary | ICD-10-CM | POA: Insufficient documentation

## 2012-08-24 DIAGNOSIS — R0781 Pleurodynia: Secondary | ICD-10-CM

## 2012-08-24 DIAGNOSIS — G8911 Acute pain due to trauma: Secondary | ICD-10-CM | POA: Insufficient documentation

## 2012-08-24 DIAGNOSIS — R51 Headache: Secondary | ICD-10-CM | POA: Insufficient documentation

## 2012-08-24 DIAGNOSIS — F17200 Nicotine dependence, unspecified, uncomplicated: Secondary | ICD-10-CM | POA: Insufficient documentation

## 2012-08-24 DIAGNOSIS — Z8739 Personal history of other diseases of the musculoskeletal system and connective tissue: Secondary | ICD-10-CM | POA: Insufficient documentation

## 2012-08-24 DIAGNOSIS — M25579 Pain in unspecified ankle and joints of unspecified foot: Secondary | ICD-10-CM | POA: Insufficient documentation

## 2012-08-24 DIAGNOSIS — M79671 Pain in right foot: Secondary | ICD-10-CM

## 2012-08-24 DIAGNOSIS — Z862 Personal history of diseases of the blood and blood-forming organs and certain disorders involving the immune mechanism: Secondary | ICD-10-CM | POA: Insufficient documentation

## 2012-08-24 DIAGNOSIS — Z79899 Other long term (current) drug therapy: Secondary | ICD-10-CM | POA: Insufficient documentation

## 2012-08-24 MED ORDER — IBUPROFEN 800 MG PO TABS
800.0000 mg | ORAL_TABLET | Freq: Once | ORAL | Status: AC
  Administered 2012-08-24: 800 mg via ORAL
  Filled 2012-08-24: qty 1
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
## 2012-08-24 NOTE — ED Notes (Signed)
Patient transported to X-ray 
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
## 2012-09-16 ENCOUNTER — Encounter (HOSPITAL_COMMUNITY): Payer: Self-pay | Admitting: Adult Health

## 2012-09-16 ENCOUNTER — Emergency Department (HOSPITAL_COMMUNITY)
Admission: EM | Admit: 2012-09-16 | Discharge: 2012-09-16 | Disposition: A | Discharge status: Home / Self Care | Payer: Medicaid Other | Attending: Emergency Medicine | Admitting: Emergency Medicine

## 2012-09-16 DIAGNOSIS — R5381 Other malaise: Secondary | ICD-10-CM | POA: Insufficient documentation

## 2012-09-16 DIAGNOSIS — Z8659 Personal history of other mental and behavioral disorders: Secondary | ICD-10-CM | POA: Insufficient documentation

## 2012-09-16 DIAGNOSIS — E119 Type 2 diabetes mellitus without complications: Secondary | ICD-10-CM | POA: Insufficient documentation

## 2012-09-16 DIAGNOSIS — Z3202 Encounter for pregnancy test, result negative: Secondary | ICD-10-CM | POA: Insufficient documentation

## 2012-09-16 DIAGNOSIS — Z8739 Personal history of other diseases of the musculoskeletal system and connective tissue: Secondary | ICD-10-CM | POA: Insufficient documentation

## 2012-09-16 DIAGNOSIS — A599 Trichomoniasis, unspecified: Secondary | ICD-10-CM | POA: Insufficient documentation

## 2012-09-16 DIAGNOSIS — M545 Low back pain, unspecified: Secondary | ICD-10-CM | POA: Insufficient documentation

## 2012-09-16 DIAGNOSIS — J45909 Unspecified asthma, uncomplicated: Secondary | ICD-10-CM | POA: Insufficient documentation

## 2012-09-16 DIAGNOSIS — F17200 Nicotine dependence, unspecified, uncomplicated: Secondary | ICD-10-CM | POA: Insufficient documentation

## 2012-09-16 DIAGNOSIS — A64 Unspecified sexually transmitted disease: Secondary | ICD-10-CM | POA: Insufficient documentation

## 2012-09-16 DIAGNOSIS — Z862 Personal history of diseases of the blood and blood-forming organs and certain disorders involving the immune mechanism: Secondary | ICD-10-CM | POA: Insufficient documentation

## 2012-09-16 DIAGNOSIS — M549 Dorsalgia, unspecified: Secondary | ICD-10-CM

## 2012-09-16 LAB — URINALYSIS, ROUTINE W REFLEX MICROSCOPIC
Bilirubin Urine: NEGATIVE
Hgb urine dipstick: NEGATIVE
Ketones, ur: 15 mg/dL — AB
Nitrite: POSITIVE — AB
Protein, ur: NEGATIVE mg/dL
Specific Gravity, Urine: 1.018 (ref 1.005–1.030)
Urobilinogen, UA: 1 mg/dL (ref 0.0–1.0)

## 2012-09-16 LAB — URINE MICROSCOPIC-ADD ON

## 2012-09-16 MED ORDER — METRONIDAZOLE 500 MG PO TABS: 2000.0000 mg | ORAL_TABLET | Freq: Once | ORAL | Status: DC

## 2012-09-16 MED ORDER — METRONIDAZOLE 500 MG PO TABS
2000.0000 mg | ORAL_TABLET | Freq: Once | ORAL | Status: AC
  Administered 2012-09-16: 2000 mg via ORAL
  Filled 2012-09-16: qty 4

## 2012-09-16 MED ORDER — ONDANSETRON 4 MG PO TBDP
ORAL_TABLET | ORAL | Status: AC
  Administered 2012-09-16: 8 mg
  Filled 2012-09-16: qty 2

## 2012-09-16 MED ORDER — NAPROXEN 500 MG PO TABS: 500.0000 mg | ORAL_TABLET | Freq: Two times a day (BID) | ORAL | Status: DC

## 2012-09-16 MED ORDER — KETOROLAC TROMETHAMINE 30 MG/ML IJ SOLN
30.0000 mg | Freq: Once | INTRAMUSCULAR | Status: AC
  Administered 2012-09-16: 30 mg via INTRAMUSCULAR
  Filled 2012-09-16: qty 1

## 2012-09-16 NOTE — ED Notes (Addendum)
Presents with lower back pain and radiation down right leg.states, "My leg gives out on me and I have trouble waking when it happens" pain began 3 days ago. Pain is described as sharp and worse with movement and pressure. CMS intact. Pain began while playing with children at home.

## 2012-09-16 NOTE — ED Provider Notes (Signed)
CSN: [id number]     Arrival date & time 09/16/12  0058 History     First MD Initiated Contact with Patient 09/16/12 0140     Chief Complaint  Patient presents with  . Back Pain   HPI  History provided by the patient. Patient is a 27 year old female with history of bipolar disorder, sickle cell trait, diabetes and scoliosis who presents with complaints of gradually worsening low back and right-sided pain. Symptoms began 3 days ago and have been gradually worsening. She had a sudden worsening and sharp pains in her low back last night and early this morning. She reports sharp pains cause her right leg to go weak and buckled from under her. Symptoms are worse with standing and walking. They are also worse with laying on the right side in bed. He improved with rest and laying on the left side. Patient has tried using ibuprofen and Tylenol without any relief. She denies any numbness in the lower extremities. Denies any urinary or fecal incontinence, urinary retention or perineal numbness. No other aggravating or alleviating factors. No other associated symptoms.    Past Medical History  Diagnosis Date  . Scoliosis   . Sickle cell trait   . Hx of tubal ligation   . Asthma   . Sickle cell anemia   . Bipolar disorder   . Diabetes mellitus without complication    Past Surgical History  Procedure Laterality Date  . Tubal ligation     History reviewed. No pertinent family history. History  Substance Use Topics  . Smoking status: Current Every Day Smoker  . Smokeless tobacco: Never Used  . Alcohol Use: No   OB History   Grav Para Term Preterm Abortions TAB SAB Ect Mult Living                 Review of Systems  Constitutional: Negative for fever, chills and diaphoresis.  Gastrointestinal: Negative for abdominal pain.  Genitourinary: Negative for dysuria, frequency, hematuria and flank pain.  Musculoskeletal: Positive for back pain.  Neurological: Positive for weakness. Negative for  numbness.  All other systems reviewed and are negative.    Allergies  Other and Tomato  Home Medications   Current Outpatient Rx  Name  Route  Sig  Dispense  Refill  . albuterol (PROVENTIL HFA;VENTOLIN HFA) 108 (90 BASE) MCG/ACT inhaler   Inhalation   Inhale 2 puffs into the lungs every 6 (six) hours as needed. Wheezing         . ibuprofen (ADVIL,MOTRIN) 600 MG tablet   Oral   Take 1 tablet (600 mg total) by mouth every 8 (eight) hours as needed for pain.   30 tablet   0   . oxyCODONE-acetaminophen (PERCOCET/ROXICET) 5-325 MG per tablet   Oral   Take 1 tablet by mouth every 4 (four) hours as needed for pain.          BP 101/70  Pulse 71  Temp(Src) 98.4 F (36.9 C) (Oral)  Resp 18  SpO2 97%  LMP 07/18/2012 Physical Exam  Nursing note and vitals reviewed. Constitutional: She is oriented to person, place, and time. She appears well-developed and well-nourished. No distress.  HENT:  Head: Normocephalic.  Cardiovascular: Normal rate and regular rhythm.   Pulmonary/Chest: Effort normal and breath sounds normal. No respiratory distress.  Abdominal: Soft. There is no tenderness. There is no rebound and no guarding.  No CVA tenderness  Musculoskeletal: Normal range of motion. She exhibits no edema and no tenderness.  Lumbar back: She exhibits tenderness. She exhibits no bony tenderness, no swelling and no deformity.       Back:  Neurological: She is alert and oriented to person, place, and time. She has normal strength. No sensory deficit.  Reflex Scores:      Patellar reflexes are 2+ on the right side and 2+ on the left side. Normal and equal strength in lower extremities. Normal sensations.  Skin: Skin is warm and dry. No rash noted.  Psychiatric: She has a normal mood and affect. Her behavior is normal.    ED Course   Procedures   Results for orders placed during the hospital encounter of 09/16/12  URINALYSIS, ROUTINE W REFLEX MICROSCOPIC      Result  Value Range   Color, Urine AMBER (*) YELLOW   APPearance CLOUDY (*) CLEAR   Specific Gravity, Urine 1.018  1.005 - 1.030   pH 7.0  5.0 - 8.0   Glucose, UA NEGATIVE  NEGATIVE mg/dL   Hgb urine dipstick NEGATIVE  NEGATIVE   Bilirubin Urine NEGATIVE  NEGATIVE   Ketones, ur 15 (*) NEGATIVE mg/dL   Protein, ur NEGATIVE  NEGATIVE mg/dL   Urobilinogen, UA 1.0  0.0 - 1.0 mg/dL   Nitrite POSITIVE (*) NEGATIVE   Leukocytes, UA LARGE (*) NEGATIVE  URINE MICROSCOPIC-ADD ON      Result Value Range   Squamous Epithelial / LPF RARE  RARE   WBC, UA 3-6  <3 WBC/hpf   RBC / HPF 0-2  <3 RBC/hpf   Bacteria, UA MANY (*) RARE   Urine-Other TRICHOMONAS PRESENT    POCT PREGNANCY, URINE      Result Value Range   Preg Test, Ur NEGATIVE  NEGATIVE       1. Trichimoniasis   2. STD (sexually transmitted disease)   3. Back pain        MDM  Patient seen and evaluated. Patient resting in bed appears well in no acute distress. She has no concerning or red flag symptoms her pain. She is able to stand and walk normally. She has normal movements at the waist. Does exhibit tenderness to lower lumbar area. No deformities. No weakness in lower extremities. Normal patellar reflex.  The patient up walking normally without assistance in the emergency department. Suspect pain is caused from muscle strain and spasming. Patient also found to have trichomonas on UA. She denies any vaginal discharge or vaginal bleeding. No vaginal pains.  Patient became nauseous with one episode of vomiting after taking first 2 pills of Flagyl. ODT Zofran given.  Patient now feeling better tolerating by mouth fluids and crackers. She will be given a prescription for Flagyl to take with food after returning home. She agrees with plan.   Angus Seller, PA-C 09/16/12 [phone]

## 2012-09-16 NOTE — ED Notes (Signed)
Pt started throwing up after swallowing 2 flagyl pills.  EDPA made aware. Verbal given for 8mg  of Zofran ODT
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
## 2012-09-16 NOTE — ED Provider Notes (Signed)
Medical screening examination/treatment/procedure(s) were performed by non-physician practitioner and as supervising physician I was immediately available for consultation/collaboration.  Jones Skene, M.D.     Jones Skene, MD 09/16/12 [phone]

## 2012-09-18 LAB — GC/CHLAMYDIA PROBE AMP: GC Probe RNA: NEGATIVE

## 2012-09-19 LAB — URINE CULTURE: Colony Count: >=100000

## 2012-09-20 ENCOUNTER — Telehealth (HOSPITAL_COMMUNITY): Payer: Self-pay | Admitting: *Deleted

## 2012-09-20 NOTE — Progress Notes (Signed)
ED Antimicrobial Stewardship Positive Culture Follow Up   [name] is an 27 y.o. female who presented to [facility] on 09/16/2012 with a chief complaint of  Chief Complaint  Patient presents with  . Back Pain     Recent Results (from the past 720 hour(s))  URINE CULTURE     Status: None   Collection Time    09/16/12  2:01 AM      Result Value Range Status   Specimen Description URINE, RANDOM   Final   Special Requests NONE ADDED AT 0232   Final   Culture  Setup Time     Final   Value: 09/16/2012 02:44     Performed at [facility]   Colony Count     Final   Value: >=100,000 COLONIES/ML     Performed at [facility]   Culture     Final   Value: ESCHERICHIA COLI     ENTEROCOCCUS SPECIES     Performed at [facility]   Report Status 09/19/2012 FINAL   Final   Organism ID, Bacteria ENTEROCOCCUS SPECIES   Final   Organism ID, Bacteria ESCHERICHIA COLI   Final    [x]  Treated with Flagyl for trichomonas, but Flagyl does not cover Ecoli or Enterococcal organisms.  []  Patient discharged originally without antimicrobial agent and treatment is now indicated  New antibiotic prescription: Amoxicillin 500mg  PO TID x 7 days  ED Provider: Trixie Dredge, PA-C   Cleon Dew 09/20/2012, 11:06 AM Infectious Diseases Pharmacist Phone# [phone]

## 2012-09-26 ENCOUNTER — Emergency Department (HOSPITAL_COMMUNITY)
Admission: EM | Admit: 2012-09-26 | Discharge: 2012-09-26 | Disposition: A | Discharge status: Home / Self Care | Payer: Medicaid Other | Attending: Emergency Medicine | Admitting: Emergency Medicine

## 2012-09-26 ENCOUNTER — Encounter (HOSPITAL_COMMUNITY): Payer: Self-pay | Admitting: *Deleted

## 2012-09-26 DIAGNOSIS — R112 Nausea with vomiting, unspecified: Secondary | ICD-10-CM | POA: Insufficient documentation

## 2012-09-26 DIAGNOSIS — Z862 Personal history of diseases of the blood and blood-forming organs and certain disorders involving the immune mechanism: Secondary | ICD-10-CM | POA: Insufficient documentation

## 2012-09-26 DIAGNOSIS — IMO0001 Reserved for inherently not codable concepts without codable children: Secondary | ICD-10-CM | POA: Insufficient documentation

## 2012-09-26 DIAGNOSIS — Z79899 Other long term (current) drug therapy: Secondary | ICD-10-CM | POA: Insufficient documentation

## 2012-09-26 DIAGNOSIS — R52 Pain, unspecified: Secondary | ICD-10-CM | POA: Insufficient documentation

## 2012-09-26 DIAGNOSIS — R51 Headache: Secondary | ICD-10-CM | POA: Insufficient documentation

## 2012-09-26 DIAGNOSIS — J45901 Unspecified asthma with (acute) exacerbation: Secondary | ICD-10-CM | POA: Insufficient documentation

## 2012-09-26 DIAGNOSIS — Z8739 Personal history of other diseases of the musculoskeletal system and connective tissue: Secondary | ICD-10-CM | POA: Insufficient documentation

## 2012-09-26 DIAGNOSIS — M791 Myalgia, unspecified site: Secondary | ICD-10-CM

## 2012-09-26 DIAGNOSIS — Z9851 Tubal ligation status: Secondary | ICD-10-CM | POA: Insufficient documentation

## 2012-09-26 DIAGNOSIS — F17200 Nicotine dependence, unspecified, uncomplicated: Secondary | ICD-10-CM | POA: Insufficient documentation

## 2012-09-26 DIAGNOSIS — Z8659 Personal history of other mental and behavioral disorders: Secondary | ICD-10-CM | POA: Insufficient documentation

## 2012-09-26 DIAGNOSIS — E119 Type 2 diabetes mellitus without complications: Secondary | ICD-10-CM | POA: Insufficient documentation

## 2012-09-26 MED ORDER — IBUPROFEN 800 MG PO TABS
800.0000 mg | ORAL_TABLET | Freq: Once | ORAL | Status: AC
  Administered 2012-09-26: 800 mg via ORAL
  Filled 2012-09-26: qty 1

## 2012-09-26 MED ORDER — ALBUTEROL SULFATE HFA 108 (90 BASE) MCG/ACT IN AERS
2.0000 | INHALATION_SPRAY | RESPIRATORY_TRACT | Status: DC | PRN
  Administered 2012-09-26: 2 via RESPIRATORY_TRACT
  Filled 2012-09-26: qty 6.7

## 2012-09-26 MED ORDER — METHOCARBAMOL 500 MG PO TABS: 500.0000 mg | ORAL_TABLET | Freq: Two times a day (BID) | ORAL | Status: DC
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
## 2012-09-26 NOTE — ED Notes (Signed)
Pt requesting to speak with social worker regarding community resources since she is homeless. Contacted Child psychotherapist and she is agreeable to consult.
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
## 2012-09-26 NOTE — ED Notes (Signed)
Social worker at bedside.
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
## 2012-09-26 NOTE — ED Provider Notes (Signed)
CSN: [id number]     Arrival date & time 09/26/12  2206 History    This chart was scribed for a non-physician practitioner, Fayrene Helper, PA-C, working with Claudean Kinds, MD by Frederik Pear, ED Scribe. This patient was seen in room WTR9/WTR9 and the patient's care was started at 2212.   First MD Initiated Contact with Patient 09/26/12 2212     Chief Complaint  Patient presents with  . Generalized Pain    (Consider location/radiation/quality/duration/timing/severity/associated sxs/prior Treatment) The history is provided by the patient. No language interpreter was used.   HPI Comments: [name] is a 27 y.o. female brought in by EMS who presents to the Emergency Department complaining of sudden onset, stabbing, generalized pain from her right shoulder to toes and HA that began 2 days ago, but significnatly worsened today. She reports she carried her purse on her left side and she is predominantly right handed. She reports she was put kicked out of the home she has been staying at with a family member today and has been walking around all day. She has a h/o of asthma and reports she has been SOB since she ran out of her albuterol inhaler this morning. She denies fever, emesis, diarrhea, chills, and CP.  She treated the pain with 2 ibuprofen yesterday without relief. She reports she has taken Percocet for pain in the past, and is currently out of the medication.  Past Medical History  Diagnosis Date  . Scoliosis   . Sickle cell trait   . Hx of tubal ligation   . Asthma   . Sickle cell anemia   . Bipolar disorder   . Diabetes mellitus without complication    Past Surgical History  Procedure Laterality Date  . Tubal ligation     History reviewed. No pertinent family history. History  Substance Use Topics  . Smoking status: Current Every Day Smoker  . Smokeless tobacco: Never Used  . Alcohol Use: No   OB History   Grav Para Term Preterm Abortions TAB SAB Ect Mult Living                  Review of Systems  Respiratory: Positive for shortness of breath.   Gastrointestinal: Positive for nausea.  Musculoskeletal: Positive for myalgias.  Neurological: Positive for headaches.  All other systems reviewed and are negative.   Allergies  Other and Tomato  Home Medications   Current Outpatient Rx  Name  Route  Sig  Dispense  Refill  . albuterol (PROVENTIL HFA;VENTOLIN HFA) 108 (90 BASE) MCG/ACT inhaler   Inhalation   Inhale 2 puffs into the lungs every 6 (six) hours as needed. Wheezing         . ibuprofen (ADVIL,MOTRIN) 600 MG tablet   Oral   Take 1 tablet (600 mg total) by mouth every 8 (eight) hours as needed for pain.   30 tablet   0   . metroNIDAZOLE (FLAGYL) 500 MG tablet   Oral   Take 4 tablets (2,000 mg total) by mouth once. With food   4 tablet   0   . naproxen (NAPROSYN) 500 MG tablet   Oral   Take 1 tablet (500 mg total) by mouth 2 (two) times daily.   30 tablet   0   . oxyCODONE-acetaminophen (PERCOCET/ROXICET) 5-325 MG per tablet   Oral   Take 1 tablet by mouth every 4 (four) hours as needed for pain.  BP 101/67  Pulse 73  Temp(Src) 98.7 F (37.1 C) (Oral)  Resp 16  SpO2 99% Physical Exam  Nursing note and vitals reviewed. Constitutional: She is oriented to person, place, and time. She appears well-developed and well-nourished. No distress.  HENT:  Head: Normocephalic and atraumatic.  Eyes: EOM are normal. Pupils are equal, round, and reactive to light.  Neck: Normal range of motion. Neck supple. No tracheal deviation present.  No nuchal rigidity  Cardiovascular: Normal rate.   Pulmonary/Chest: Effort normal. No respiratory distress.  Abdominal: Soft. She exhibits no distension.  Musculoskeletal: Normal range of motion. She exhibits no edema and no tenderness.  Pt with generalized tenderness along the entire right side of her body from shoulder to toes. No focal tenderness on exam. NV intact.  Neurological: She  is alert and oriented to person, place, and time. She has normal strength. No sensory deficit.  Patellar DTR is intact on the right. Grip strength is equal and 5/5 in the lower and upper extremities. Slow, but steady non-antalgic gait.   Skin: Skin is warm and dry.  Psychiatric: She has a normal mood and affect. Her behavior is normal.   ED Course   Procedures (including critical care time)  DIAGNOSTIC STUDIES: Oxygen Saturation is 99% on room air, normal by my interpretation.    COORDINATION OF CARE:  22:27- Discussed planned course of treatment in the ED, including an albuterol inhaler and a consult with a Child psychotherapist, who is agreeable at this time.  22:35- Spoke with the social worker on duty who will see the pt in the ED. The pt will be discharged with a course of Robaxin and education regarding narcotic medications.  22:45- Nursing staff reports the pt is still complaining of pain. They were advised to offer the pt 800 mg of ibuprofen. The pt is agreeable at this time, and the order has been placed.  Medications  albuterol (PROVENTIL HFA;VENTOLIN HFA) 108 (90 BASE) MCG/ACT inhaler 2 puff (2 puffs Inhalation Given 09/26/12 2251)  ibuprofen (ADVIL,MOTRIN) tablet 800 mg (800 mg Oral Given 09/26/12 2250)   Labs Reviewed - No data to display No results found. 1. Generalized muscle ache     MDM  BP 101/67  Pulse 73  Temp(Src) 98.7 F (37.1 C) (Oral)  Resp 16  SpO2 99%  I personally performed the services described in this documentation, which was scribed in my presence. The recorded information has been reviewed and is accurate.     Fayrene Helper, PA-C 09/26/12 2302

## 2012-09-29 NOTE — ED Provider Notes (Signed)
Medical screening examination/treatment/procedure(s) were performed by non-physician practitioner and as supervising physician I was immediately available for consultation/collaboration.   Lyvonne Cassell Joseph Kaleiah Kutzer, MD 09/29/12 1111 

## 2013-10-27 ENCOUNTER — Encounter (HOSPITAL_COMMUNITY): Payer: Self-pay | Admitting: Emergency Medicine

## 2013-10-27 ENCOUNTER — Emergency Department (HOSPITAL_COMMUNITY): Payer: Medicaid Other

## 2013-10-27 ENCOUNTER — Emergency Department (HOSPITAL_COMMUNITY)
Admission: EM | Admit: 2013-10-27 | Discharge: 2013-10-27 | Disposition: A | Discharge status: Home / Self Care | Payer: Medicaid Other | Attending: Emergency Medicine | Admitting: Emergency Medicine

## 2013-10-27 DIAGNOSIS — M791 Myalgia, unspecified site: Secondary | ICD-10-CM

## 2013-10-27 DIAGNOSIS — N390 Urinary tract infection, site not specified: Secondary | ICD-10-CM | POA: Diagnosis not present

## 2013-10-27 DIAGNOSIS — R1110 Vomiting, unspecified: Secondary | ICD-10-CM | POA: Diagnosis present

## 2013-10-27 DIAGNOSIS — J45909 Unspecified asthma, uncomplicated: Secondary | ICD-10-CM | POA: Insufficient documentation

## 2013-10-27 DIAGNOSIS — Z862 Personal history of diseases of the blood and blood-forming organs and certain disorders involving the immune mechanism: Secondary | ICD-10-CM | POA: Diagnosis not present

## 2013-10-27 DIAGNOSIS — Z79899 Other long term (current) drug therapy: Secondary | ICD-10-CM | POA: Diagnosis not present

## 2013-10-27 DIAGNOSIS — E119 Type 2 diabetes mellitus without complications: Secondary | ICD-10-CM | POA: Insufficient documentation

## 2013-10-27 DIAGNOSIS — F17200 Nicotine dependence, unspecified, uncomplicated: Secondary | ICD-10-CM | POA: Insufficient documentation

## 2013-10-27 DIAGNOSIS — R9431 Abnormal electrocardiogram [ECG] [EKG]: Secondary | ICD-10-CM | POA: Diagnosis not present

## 2013-10-27 DIAGNOSIS — Z8739 Personal history of other diseases of the musculoskeletal system and connective tissue: Secondary | ICD-10-CM | POA: Insufficient documentation

## 2013-10-27 DIAGNOSIS — Z9851 Tubal ligation status: Secondary | ICD-10-CM | POA: Diagnosis not present

## 2013-10-27 DIAGNOSIS — R079 Chest pain, unspecified: Secondary | ICD-10-CM | POA: Diagnosis not present

## 2013-10-27 DIAGNOSIS — IMO0001 Reserved for inherently not codable concepts without codable children: Secondary | ICD-10-CM | POA: Insufficient documentation

## 2013-10-27 DIAGNOSIS — Z3202 Encounter for pregnancy test, result negative: Secondary | ICD-10-CM | POA: Insufficient documentation

## 2013-10-27 DIAGNOSIS — Z8659 Personal history of other mental and behavioral disorders: Secondary | ICD-10-CM | POA: Insufficient documentation

## 2013-10-27 LAB — CBC WITH DIFFERENTIAL/PLATELET
BASOS PCT: 0 % (ref 0–1)
Basophils Absolute: 0 10*3/uL (ref 0.0–0.1)
EOS ABS: 0.1 10*3/uL (ref 0.0–0.7)
Eosinophils Relative: 1 % (ref 0–5)
HCT: 38.1 % (ref 36.0–46.0)
Hemoglobin: 12.9 g/dL (ref 12.0–15.0)
Lymphocytes Relative: 12 % (ref 12–46)
Lymphs Abs: 1.3 10*3/uL (ref 0.7–4.0)
MCH: 29.4 pg (ref 26.0–34.0)
MCHC: 33.9 g/dL (ref 30.0–36.0)
MCV: 86.8 fL (ref 78.0–100.0)
MONOS PCT: 6 % (ref 3–12)
Monocytes Absolute: 0.7 10*3/uL (ref 0.1–1.0)
NEUTROS PCT: 81 % — AB (ref 43–77)
Neutro Abs: 8.9 10*3/uL — ABNORMAL HIGH (ref 1.7–7.7)
PLATELETS: 274 10*3/uL (ref 150–400)
RBC: 4.39 MIL/uL (ref 3.87–5.11)
RDW: 14.7 % (ref 11.5–15.5)
WBC: 11 10*3/uL — ABNORMAL HIGH (ref 4.0–10.5)

## 2013-10-27 LAB — COMPREHENSIVE METABOLIC PANEL
ALBUMIN: 3.5 g/dL (ref 3.5–5.2)
ALK PHOS: 75 U/L (ref 39–117)
ALT: 9 U/L (ref 0–35)
ANION GAP: 12 (ref 5–15)
AST: 17 U/L (ref 0–37)
BUN: 7 mg/dL (ref 6–23)
CALCIUM: 8.8 mg/dL (ref 8.4–10.5)
CO2: 24 mEq/L (ref 19–32)
Chloride: 104 mEq/L (ref 96–112)
Creatinine, Ser: 0.68 mg/dL (ref 0.50–1.10)
GFR calc Af Amer: >90 mL/min (ref >90)
GFR calc non Af Amer: >90 mL/min (ref >90)
Glucose, Bld: 83 mg/dL (ref 70–99)
POTASSIUM: 3.6 meq/L — AB (ref 3.7–5.3)
SODIUM: 140 meq/L (ref 137–147)
TOTAL PROTEIN: 6.6 g/dL (ref 6.0–8.3)
Total Bilirubin: 0.5 mg/dL (ref 0.3–1.2)

## 2013-10-27 LAB — URINE MICROSCOPIC-ADD ON

## 2013-10-27 LAB — URINALYSIS, ROUTINE W REFLEX MICROSCOPIC
BILIRUBIN URINE: NEGATIVE
Glucose, UA: NEGATIVE mg/dL
Hgb urine dipstick: NEGATIVE
Ketones, ur: NEGATIVE mg/dL
NITRITE: POSITIVE — AB
PH: 8 (ref 5.0–8.0)
Protein, ur: NEGATIVE mg/dL
SPECIFIC GRAVITY, URINE: 1.014 (ref 1.005–1.030)
Urobilinogen, UA: 0.2 mg/dL (ref 0.0–1.0)

## 2013-10-27 LAB — RETICULOCYTES
RBC.: 4.34 MIL/uL (ref 3.87–5.11)
RETIC CT PCT: 0.9 % (ref 0.4–3.1)
Retic Count, Absolute: 39.1 10*3/uL (ref 19.0–186.0)

## 2013-10-27 LAB — POC URINE PREG, ED: PREG TEST UR: NEGATIVE

## 2013-10-27 MED ORDER — CEFTRIAXONE SODIUM 1 G IJ SOLR
1.0000 g | Freq: Once | INTRAMUSCULAR | Status: AC
  Administered 2013-10-27: 1 g via INTRAVENOUS
  Filled 2013-10-27: qty 10

## 2013-10-27 MED ORDER — ALBUTEROL SULFATE HFA 108 (90 BASE) MCG/ACT IN AERS
2.0000 | INHALATION_SPRAY | Freq: Once | RESPIRATORY_TRACT | Status: AC
  Administered 2013-10-27: 2 via RESPIRATORY_TRACT
  Filled 2013-10-27: qty 6.7

## 2013-10-27 MED ORDER — CEPHALEXIN 500 MG PO CAPS: 500.0000 mg | ORAL_CAPSULE | Freq: Two times a day (BID) | ORAL | Status: DC

## 2013-10-27 MED ORDER — SODIUM CHLORIDE 0.9 % IV BOLUS (SEPSIS)
1000.0000 mL | Freq: Once | INTRAVENOUS | Status: AC
  Administered 2013-10-27: 1000 mL via INTRAVENOUS

## 2013-10-27 MED ORDER — KETOROLAC TROMETHAMINE 30 MG/ML IJ SOLN
30.0000 mg | Freq: Once | INTRAMUSCULAR | Status: AC
  Administered 2013-10-27: 30 mg via INTRAVENOUS
  Filled 2013-10-27: qty 1

## 2013-10-27 NOTE — ED Notes (Signed)
Bus pass given to patient at time of discharge

## 2013-10-27 NOTE — ED Notes (Signed)
Pt reports not feeling well for several days, which worsened today. Pt reports nausea, emesis, and seizures over the past several days. Pt has sickle cell and is unsure if she is also having a crisis, which she reports her entire right side is hurting. Pt is A/O x4 and in NAD.

## 2013-10-27 NOTE — ED Notes (Signed)
Patient reports symptoms started as right sided pain, emesis, nausea, "blackouts", lightheadedness, seizures, sickle cell crisis. Currently, patient reports pain to entire right side of body, rates pain 10/10.

## 2013-10-27 NOTE — ED Provider Notes (Signed)
Medical screening examination/treatment/procedure(s) were performed by non-physician practitioner and as supervising physician I was immediately available for consultation/collaboration.   EKG Interpretation   Date/Time:  Saturday October 27 2013 15:58:30 EDT Ventricular Rate:  53 PR Interval:  131 QRS Duration: 89 QT Interval:  609 QTC Calculation: 572 R Axis:   47 Text Interpretation:  Sinus rhythm Nonspecific T abnormalities, diffuse  leads Prolonged QT interval No significant change since last tracing  Confirmed by Izabelle Daus  MD-J, Tahj Njoku ([id number]) on 10/27/2013 4:24:28 PM        Linwood Dibbles, MD 10/27/13 2336

## 2013-10-27 NOTE — Discharge Instructions (Signed)
Read the information below.  Use the prescribed medication as directed.  Please discuss all new medications with your pharmacist.  You may return to the Emergency Department at any time for worsening condition or any new symptoms that concern you.  If you develop high fevers, worsening pain, uncontrolled vomiting, you pass out, or are unable to tolerate fluids by mouth, return to the ER for a recheck.      Urinary Tract Infection A urinary tract infection (UTI) can occur any place along the urinary tract. The tract includes the kidneys, ureters, bladder, and urethra. A type of germ called bacteria often causes a UTI. UTIs are often helped with antibiotic medicine.  HOME CARE   If given, take antibiotics as told by your doctor. Finish them even if you start to feel better.  Drink enough fluids to keep your pee (urine) clear or pale yellow.  Avoid tea, drinks with caffeine, and bubbly (carbonated) drinks.  Pee often. Avoid holding your pee in for a long time.  Pee before and after having sex (intercourse).  Wipe from front to back after you poop (bowel movement) if you are a woman. Use each tissue only once. GET HELP RIGHT AWAY IF:   You have back pain.  You have lower belly (abdominal) pain.  You have chills.  You feel sick to your stomach (nauseous).  You throw up (vomit).  Your burning or discomfort with peeing does not go away.  You have a fever.  Your symptoms are not better in 3 days. MAKE SURE YOU:   Understand these instructions.  Will watch your condition.  Will get help right away if you are not doing well or get worse. Document Released: 07/21/2007 Document Revised: 10/27/2011 Document Reviewed: 09/02/2011 [facility] Patient Information 2015 [place], [place]. This information is not intended to replace advice given to you by your health care provider. Make sure you discuss any questions you have with your health care provider.  Musculoskeletal Pain Musculoskeletal  pain is muscle and boney aches and pains. These pains can occur in any part of the body. Your caregiver may treat you without knowing the cause of the pain. They may treat you if blood or urine tests, X-rays, and other tests were normal.  CAUSES There is often not a definite cause or reason for these pains. These pains may be caused by a type of germ (virus). The discomfort may also come from overuse. Overuse includes working out too hard when your body is not fit. Boney aches also come from weather changes. Bone is sensitive to atmospheric pressure changes. HOME CARE INSTRUCTIONS   Ask when your test results will be ready. Make sure you get your test results.  Only take over-the-counter or prescription medicines for pain, discomfort, or fever as directed by your caregiver. If you were given medications for your condition, do not drive, operate machinery or power tools, or sign legal documents for 24 hours. Do not drink alcohol. Do not take sleeping pills or other medications that may interfere with treatment.  Continue all activities unless the activities cause more pain. When the pain lessens, slowly resume normal activities. Gradually increase the intensity and duration of the activities or exercise.  During periods of severe pain, bed rest may be helpful. Lay or sit in any position that is comfortable.  Putting ice on the injured area.  Put ice in a bag.  Place a towel between your skin and the bag.  Leave the ice on for 15 to  20 minutes, 3 to 4 times a day.  Follow up with your caregiver for continued problems and no reason can be found for the pain. If the pain becomes worse or does not go away, it may be necessary to repeat tests or do additional testing. Your caregiver may need to look further for a possible cause. SEEK IMMEDIATE MEDICAL CARE IF:  You have pain that is getting worse and is not relieved by medications.  You develop chest pain that is associated with shortness or  breath, sweating, feeling sick to your stomach (nauseous), or throw up (vomit).  Your pain becomes localized to the abdomen.  You develop any new symptoms that seem different or that concern you. MAKE SURE YOU:   Understand these instructions.  Will watch your condition.  Will get help right away if you are not doing well or get worse. Document Released: 02/01/2005 Document Revised: 04/26/2011 Document Reviewed: 10/06/2012 [facility] Patient Information 2015 [place], [place]. This information is not intended to replace advice given to you by your health care provider. Make sure you discuss any questions you have with your health care provider.    Emergency Department Resource Guide 1) Find a Doctor and Pay Out of Pocket Although you won't have to find out who is covered by your insurance plan, it is a good idea to ask around and get recommendations. You will then need to call the office and see if the doctor you have chosen will accept you as a new patient and what types of options they offer for patients who are self-pay. Some doctors offer discounts or will set up payment plans for their patients who do not have insurance, but you will need to ask so you aren't surprised when you get to your appointment.  2) Contact Your Local Health Department Not all health departments have doctors that can see patients for sick visits, but many do, so it is worth a call to see if yours does. If you don't know where your local health department is, you can check in your phone book. The CDC also has a tool to help you locate your state's health department, and many state websites also have listings of all of their local health departments.  3) Find a Walk-in Clinic If your illness is not likely to be very severe or complicated, you may want to try a walk in clinic. These are popping up all over the country in pharmacies, drugstores, and shopping centers. They're usually staffed by nurse practitioners or  physician assistants that have been trained to treat common illnesses and complaints. They're usually fairly quick and inexpensive. However, if you have serious medical issues or chronic medical problems, these are probably not your best option.  No Primary Care Doctor: - Call Health Connect at  [phone] - they can help you locate a primary care doctor that  accepts your insurance, provides certain services, etc. - Physician Referral Service- [phone]  Chronic Pain Problems: Organization         Address  Phone   Notes  [facility]  [phone] Patients need to be referred by their primary care doctor.   Medication Assistance: Organization         Address  Phone   Notes  [facility] Medication [facility] [address] [place] [place], [place] [address] [phone] --Must be a resident of [facility] -- Must have NO insurance coverage whatsoever (no Medicaid/ Medicare, etc.) -- The pt. MUST  have a primary care doctor that directs their care regularly and follows them in the community   [facility]ist  [phone]   [employer]  [phone]    Agencies that provide inexpensive medical care: Organization         Address  Phone   Notes  [facility]  [phone]   [facility]    [phone]   [facility] [address] [place], [place] [address] [phone]   [facility] [address] [place] [place] [phone]   Planned Parenthood    [phone]   [facility]    [phone]   Community Health and [facility]  [address], [place] Phone:  [phone], Fax:  [phone] Hours of Operation:  9 am - 6 pm, M-F.  Also accepts Medicaid/Medicare and self-pay.  [facility]  [address], [place] Phone: [phone], Fax: [phone]. Hours of Operation:  8:30 am - 5:30 pm, M-F.   Also accepts Medicaid and self-pay.  [facility] [place] [address] [place] Phone: [phone]   [facility] [address] Natasha Bence [place], [place] [phone], Ext. [phone] Mondays & Thursdays: 7-9 AM.  First 15 patients are seen on a first come, first serve basis.    Medicaid-accepting [facility] Providers:  Organization         Address  Phone   Notes  [facility] [address], [place] [phone] Also accepts self-pay patients.  [facility] [address] Laurell Josephs [place], [place]  [phone]   [facility] [address], [place] [phone]   [facility] [address], [place] [phone]   Renaye Rakers [address], [place]   [phone] Only accepts [place] Access [place] patients after they have their name applied to their card.   Self-Pay (no insurance) in [facility]:  Organization         Address  Phone   Notes  Sickle Cell Patients, [facility] Internal Medicine [address] [place], [place] [phone]   [facility] [address] [place], [place] [phone]   [facility] [place]  [address], [place] [phone]   [facility]/Dr. Osei-Bonsu  [address], [place] or [address], [place] [phone] Phone number for both [place] and [place] locations is the same.  Urgent Medical and [facility] [address], [place] [phone]   [facility] [address], [place] or [address] [phone]   [facility] Piech [address] [place] [phone], phone; [phone], fax Sees patients 1st and 3rd Saturday of every month.  Must not qualify for public or private insurance (i.e. Medicaid, Medicare, [place] Health Choice, Veterans'  Benefits)  Household income should be no more than 200% of the poverty level The clinic cannot treat you if you are pregnant or think you are pregnant  Sexually transmitted diseases are not treated at the clinic.    Dental Care: Organization         Address  Phone  Notes  [facility] Department of [facility] [address] [place],  [place] [phone] Accepts children up to age 20 who are enrolled in Medicaid or [place] Health Choice; pregnant women with a Medicaid card; and children who have applied for Medicaid or [place] Health Choice, but were declined, whose parents can pay a reduced fee at time of service.  [facility] Department of [facility]  [address], [place] [phone] Accepts children up to age 5 who are enrolled in [place] or [place] Health Choice; pregnant women with a Medicaid card; and children who have applied for Medicaid or [place] Health Choice, but were declined, whose parents can pay a reduced fee at time of service.  [facility]  [address] [place], [place] [phone] Patients are seen by appointment only. Walk-ins are not accepted. [facility] will see patients 47 years of age and older. Monday - Tuesday (8am-5pm) Most Wednesdays (8:30-5pm) $30 per visit, cash only  [facility] Adult Dental Access PRO[facility]  [address], [facility] [phone] Patients are seen by appointment only. Walk-ins are not accepted. [facility] will see patients 67 years of age and older. One Wednesday Evening (Monthly: Volunteer Based).  $30 per visit, cash only  [employer]  [phone] for adults; Children under age 27, call Graduate Pediatric Dentistry at [phone]. Children aged 53-14, please call [phone] to request a pediatric application.  Dental services are provided in all areas of dental care including fillings, crowns and bridges, complete and partial  dentures, implants, gum treatment, root canals, and extractions. Preventive care is also provided. Treatment is provided to both adults and children. Patients are selected via a lottery and there is often a waiting list.   [facility] [address] [place]  [phone] [email or web address]   [facility] [address] [place], [place] [phone], Ext. [phone] Second and Fourth Thursday of each month, opens at 6:30 AM; Clinic ends at 9 AM.  Patients are seen on a first-come first-served basis, and a limited number are seen during each clinic.   [facility]  [address] [place], [place] [phone]   Eligibility Requirements You must have lived in [place], [place], or [place] counties for at least the last three months.   You cannot be eligible for state or federal sponsored [place], including [employer], [place], or [employer].   You generally cannot be eligible for healthcare insurance through your employer.    How to apply: Eligibility screenings are held every Tuesday and Wednesday afternoon from 1:00 pm until 4:00 pm. You do not need an appointment for the interview!  [facility] [address] [place], [place] [phone]   [facility] Health Department  [phone]   [facility] Health Department  [phone]   [facility] Health Department  [phone]    Behavioral Health Resources in the Community: Intensive Outpatient Programs Organization         Address  Phone  Notes  [facility] [address], [place], [place] [phone]   [facility] Outpatient [address], [place], [place] [phone]   [facility] [address], [place], [place]  [phone]   [facility] Mental Health [address],  [place], [place] [phone] or [phone]   Substance Abuse Resources Organization          Address  Phone  Notes  [facility] Drug Services  [phone]   [facility]  [phone]   [facility] [place]  [phone]   Floydene Flock  [phone]   Residential & Outpatient Substance Abuse Program  [phone]   Psychological Services Organization         Address  Phone  Notes  [facility] Behavioral Health  [phone]   [facility] Services  [phone]   [facility] Mental Health [address], [place] [phone] or [phone]    Mobile Crisis Teams Organization         Address  Phone  Notes  [facility], Mobile Crisis Care Unit  [phone]   [facility]  [address] [place], [place] [phone]   Doristine Locks [address] [place] [phone]    Self-Help/Support Groups Organization         Address  Phone             Notes  [facility] [place] - variety of support groups  336- I7437963 Call for more information  Narcotics Anonymous (NA), [facility] [address], [employer] [place]  2 meetings at this location   Statistician         Address  Phone  Notes  [facility] [address],    [place]  [phone]   [facility]  [address] [place] [address], [place], [place] [phone]   [facility] [address] [place], [place] [phone] Admissions: 8am-3pm M-F  [facility] [address],    [place], [place] [phone]   [facility] [address] [place], [place], [place] [phone]   [facility] [address],  [place], [place] [phone]   [facility] - Intensive Outpatient [address] Laurell Josephs [address], [place], [place] [phone]   [facility] (Addiction [facility] [address] [place]  [place], [place] [phone] or [phone]   [facility] ([facility]) [address] [place], [place]  [phone] Accepts Medicaid  [facility] [address]  [place] [phone] Substance Abuse/Addiction Treatment   [facility] Organization         Address  Phone  Notes  [facility]  [phone]   Angie Fava, PhD [address] Ervin Knack [place], [place]   [phone] or [phone]   [facility] Behavioral   [address] [place], [place] [phone]   [facility] [address], [place], [place] [phone] Insurance/Medicaid/sponsorship through [facility] and Families [address]                                    [place], [place] [phone] Therapy/tele-psych/case  [facility] [address], [place] [phone]    Dr. Lolly Mustache  [phone]   [facility]  [employer] [facility] Dept. 1) [address], [place] 2) [address], [place] 3)  [address], [place] [phone]  [phone]   [facility] Child Abuse Hotline 5744675903 or 7128789805 (After Hours)

## 2013-10-27 NOTE — ED Provider Notes (Signed)
CSN: [id number]     Arrival date & time 10/27/13  1347 History   First MD Initiated Contact with Patient 10/27/13 1515     Chief Complaint  Patient presents with  . Emesis  . Sickle Cell Pain Crisis     (Consider location/radiation/quality/duration/timing/severity/associated sxs/prior Treatment) The history is provided by the patient.    Patient with hx bipolar disorder, sickle cell trait vs sickle cell disease (has both per chart), DM p/w one day of right sided pain incuding right sided headache, right sided chest pain, right midback pain, and right leg pain that she identifies as a pain crisis.  States she has had N/V this morning followed by lightheadedness and syncope.  States she was shaking after passing out but remembers everything.  Denies LOC.    She also notes what she calls "blackouts" which are episodes during which she is carrying out activities then suddenly realizes where she is and what she is doing.  She denies any LOC with these "blackout" episodes.  These have been going on for weeks.   The chest pain is located over her lateral chest wall.  It is intermittent x 4 days, lasts 10-15 minutes.  Exacerbated by palpation and improved with lying down on her left side.  She also has SOB and cough productive of yellow sputum.  Notes she is out of albuterol.  Also has right sided headache that feels like "someone banging drums in my head"  This has been constant x 4 days.  States this is exactly like her prior typical headaches but is lasting longer.   Has right middle back pain and right leg pain.  Pt has chronic right leg pain and this is unchanged. Defined as constant soreness.  Also had has some nasal congestion.    Denies fevers, sore throat, abdominal pain, change in bowel habits including diarrhea or constipation, urinary or vaginal symptoms.     Past Medical History  Diagnosis Date  . Scoliosis   . Sickle cell trait   . Hx of tubal ligation   . Asthma   . Sickle  cell anemia   . Bipolar disorder   . Diabetes mellitus without complication    Past Surgical History  Procedure Laterality Date  . Tubal ligation     No family history on file. History  Substance Use Topics  . Smoking status: Current Every Day Smoker  . Smokeless tobacco: Never Used  . Alcohol Use: No   OB History   Grav Para Term Preterm Abortions TAB SAB Ect Mult Living                 Review of Systems  All other systems reviewed and are negative.     Allergies  Other and Tomato  Home Medications   Prior to Admission medications   Medication Sig Start Date End Date Taking? Authorizing Provider  albuterol (PROVENTIL HFA;VENTOLIN HFA) 108 (90 BASE) MCG/ACT inhaler Inhale 2 puffs into the lungs every 6 (six) hours as needed. Wheezing   Yes Historical Provider, MD   BP 99/54  Pulse 64  Temp(Src) 98.2 F (36.8 C) (Oral)  Resp 18  SpO2 100%  LMP 10/23/2013 Physical Exam  Nursing note and vitals reviewed. Constitutional: She is oriented to person, place, and time. She appears well-developed and well-nourished. No distress.  HENT:  Head: Normocephalic and atraumatic.  Eyes: Conjunctivae are normal.  Neck: Normal range of motion. Neck supple.  Cardiovascular: Normal rate and regular rhythm.   Pulmonary/Chest:  Effort normal and breath sounds normal. No respiratory distress. She has no wheezes. She has no rales. She exhibits tenderness (right lateral chest wall, palpation reproduces pain).  Abdominal: Soft. She exhibits no distension. There is no tenderness. There is no rebound, no guarding and no CVA tenderness.  Musculoskeletal: Normal range of motion. She exhibits no edema.  Neurological: She is alert and oriented to person, place, and time. She has normal strength. She is not disoriented. No cranial nerve deficit or sensory deficit. She exhibits normal muscle tone. GCS eye subscore is 4. GCS verbal subscore is 5. GCS motor subscore is 6.  Skin: She is not  diaphoretic.    ED Course  Procedures (including critical care time) Labs Review Labs Reviewed  CBC WITH DIFFERENTIAL - Abnormal; Notable for the following:    WBC 11.0 (*)    Neutrophils Relative % 81 (*)    Neutro Abs 8.9 (*)    All other components within normal limits  COMPREHENSIVE METABOLIC PANEL - Abnormal; Notable for the following:    Potassium 3.6 (*)    All other components within normal limits  URINALYSIS, ROUTINE W REFLEX MICROSCOPIC - Abnormal; Notable for the following:    APPearance CLOUDY (*)    Nitrite POSITIVE (*)    Leukocytes, UA MODERATE (*)    All other components within normal limits  URINE MICROSCOPIC-ADD ON - Abnormal; Notable for the following:    Bacteria, UA MANY (*)    All other components within normal limits  URINE CULTURE  RETICULOCYTES  POC URINE PREG, ED    Imaging Review Dg Chest 2 View  10/27/2013   CLINICAL DATA:  Right-sided chest pain since Wednesday  EXAM: CHEST  2 VIEW  COMPARISON:  02/08/2012  FINDINGS: The heart size and mediastinal contours are within normal limits. Both lungs are clear. Scoliosis deformity involves the thoracic spine.  IMPRESSION: No active cardiopulmonary disease.   Electronically Signed   By: Signa Kell M.D.   On: 10/27/2013 16:26     EKG Interpretation   Date/Time:  Saturday October 27 2013 15:58:30 EDT Ventricular Rate:  53 PR Interval:  131 QRS Duration: 89 QT Interval:  609 QTC Calculation: 572 R Axis:   47 Text Interpretation:  Sinus rhythm Nonspecific T abnormalities, diffuse  leads Prolonged QT interval No significant change since last tracing  Confirmed by KNAPP  MD-J, JON ([id number]) on 10/27/2013 4:24:28 PM      6:46 PM Pt reports she is feeling much better.  Headache resolving.  Right lateral chest discomfort improved with albuterol.  BP now 108/66  7:03 PM Discussed pt and EKG changes with Dr Lynelle Doctor.   Discussed prlonged QT diagnosis with patient.  Pt advised to follow up with PCP.   MDM    Final diagnoses:  UTI (lower urinary tract infection)  Myalgia  Prolonged Q-T interval on ECG   Afebrile, nontoxic patient with primary complaint of right sided body pain with N/V and syncope preceded by lightheadedness.   She is feeling much better in ED after IVF, toradol.  Mild leukocytosis.  UA shows infection.  Labs otherwise unremarkable. Rocephin given for UTI.   D/C home with keflex.  PCP resources for follow up.  Discussed result, findings, treatment, and follow up  with patient.  Pt given return precautions.  Pt verbalizes understanding and agrees with plan.         Trixie Dredge, PA-C 10/27/13 2025

## 2013-10-30 LAB — URINE CULTURE: Colony Count: >=100000

## 2013-10-31 ENCOUNTER — Telehealth (HOSPITAL_BASED_OUTPATIENT_CLINIC_OR_DEPARTMENT_OTHER): Payer: Self-pay | Admitting: Emergency Medicine

## 2013-10-31 NOTE — Telephone Encounter (Signed)
Post ED Visit - Positive Culture Follow-up  Culture report reviewed by antimicrobial stewardship pharmacist:  Wes Dulaney, Pharm.D., BCPS  Celedonio Miyamoto, Pharm.D., BCPS  Georgina Pillion, Pharm.D., BCPS  [place], [address].D., BCPS, AAHIVP  Estella Husk, Pharm.D., BCPS, AAHIVP  Carly Sabat, Pharm.D.  Enzo Bi, [address].D.  Positive Urine culture Treated with Cephalexin, organism sensitive to the same and no further patient follow-up is required at this time.  Jiles Harold 10/31/2013, 5:41 PM

## 2014-01-26 ENCOUNTER — Encounter (HOSPITAL_COMMUNITY): Payer: Self-pay | Admitting: Emergency Medicine

## 2014-01-26 ENCOUNTER — Emergency Department (HOSPITAL_COMMUNITY)
Admission: EM | Admit: 2014-01-26 | Discharge: 2014-01-26 | Disposition: A | Discharge status: Home / Self Care | Payer: Medicaid Other | Attending: Emergency Medicine | Admitting: Emergency Medicine

## 2014-01-26 ENCOUNTER — Emergency Department (HOSPITAL_COMMUNITY): Payer: Medicaid Other

## 2014-01-26 DIAGNOSIS — M419 Scoliosis, unspecified: Secondary | ICD-10-CM | POA: Insufficient documentation

## 2014-01-26 DIAGNOSIS — Z862 Personal history of diseases of the blood and blood-forming organs and certain disorders involving the immune mechanism: Secondary | ICD-10-CM | POA: Diagnosis not present

## 2014-01-26 DIAGNOSIS — Y9389 Activity, other specified: Secondary | ICD-10-CM | POA: Insufficient documentation

## 2014-01-26 DIAGNOSIS — Z8659 Personal history of other mental and behavioral disorders: Secondary | ICD-10-CM | POA: Diagnosis not present

## 2014-01-26 DIAGNOSIS — Z792 Long term (current) use of antibiotics: Secondary | ICD-10-CM | POA: Insufficient documentation

## 2014-01-26 DIAGNOSIS — Y998 Other external cause status: Secondary | ICD-10-CM | POA: Diagnosis not present

## 2014-01-26 DIAGNOSIS — S99911A Unspecified injury of right ankle, initial encounter: Secondary | ICD-10-CM | POA: Diagnosis present

## 2014-01-26 DIAGNOSIS — Z79899 Other long term (current) drug therapy: Secondary | ICD-10-CM | POA: Diagnosis not present

## 2014-01-26 DIAGNOSIS — Z720 Tobacco use: Secondary | ICD-10-CM | POA: Insufficient documentation

## 2014-01-26 DIAGNOSIS — S99919A Unspecified injury of unspecified ankle, initial encounter: Secondary | ICD-10-CM

## 2014-01-26 DIAGNOSIS — M25571 Pain in right ankle and joints of right foot: Secondary | ICD-10-CM

## 2014-01-26 DIAGNOSIS — Y9289 Other specified places as the place of occurrence of the external cause: Secondary | ICD-10-CM | POA: Diagnosis not present

## 2014-01-26 DIAGNOSIS — E119 Type 2 diabetes mellitus without complications: Secondary | ICD-10-CM | POA: Insufficient documentation

## 2014-01-26 DIAGNOSIS — W1839XA Other fall on same level, initial encounter: Secondary | ICD-10-CM | POA: Diagnosis not present

## 2014-01-26 DIAGNOSIS — J45909 Unspecified asthma, uncomplicated: Secondary | ICD-10-CM | POA: Diagnosis not present

## 2014-01-26 MED ORDER — TRAMADOL HCL 50 MG PO TABS
50.0000 mg | ORAL_TABLET | Freq: Once | ORAL | Status: AC
  Administered 2014-01-26: 50 mg via ORAL
  Filled 2014-01-26: qty 1

## 2014-01-26 MED ORDER — TRAMADOL HCL 50 MG PO TABS: 50.0000 mg | ORAL_TABLET | Freq: Four times a day (QID) | ORAL | Status: DC | PRN

## 2014-01-26 NOTE — ED Notes (Signed)
Patient returned from X-ray 

## 2014-01-26 NOTE — ED Notes (Signed)
Reports right ankle pain medially and laterally since Monday or Tuesday (whichever day it rained, she states). Reports she slipped in the rain and ankle twisted.

## 2014-01-26 NOTE — Discharge Instructions (Signed)
Take the prescribed medication as directed.  May take with tylenol if needed for extra relief. Follow-up with orthopedics if no improvement in the next few days or if symptoms worsen. Return to the ED for new concerns.

## 2014-01-26 NOTE — ED Provider Notes (Signed)
CSN: [id number]     Arrival date & time 01/26/14  [id number] History   First MD Initiated Contact with Patient 01/26/14 0957     Chief Complaint  Patient presents with  . Ankle Pain     (Consider location/radiation/quality/duration/timing/severity/associated sxs/prior Treatment) Patient is a 28 y.o. female presenting with ankle pain. The history is provided by the patient and medical records.  Ankle Pain   This is a 28 y.o. F with PMH significant for scoliosis, asthma, bipolar disorder, diabetes, presenting to the ED for right ankle injury.  Patient reports on either Monday or Tuesday (not entirely sure which day) her shoe broke causing her to slip and she twisted her ankle.  No head injury or LOC.  States since this time she has been having pain on the medial and lateral aspect of her right ankle.  Pain worse with weight bearing and ambulation.  Denies numbness or paresthesias.  Prior right ankle injuries, not entirely sure what, but did have to wear a CAM walker for a few weeks.  Has tried OTC motrin and tylenol without noted relief.  Past Medical History  Diagnosis Date  . Scoliosis   . Sickle cell trait   . Hx of tubal ligation   . Asthma   . Sickle cell anemia   . Bipolar disorder   . Diabetes mellitus without complication    Past Surgical History  Procedure Laterality Date  . Tubal ligation     History reviewed. No pertinent family history. History  Substance Use Topics  . Smoking status: Current Every Day Smoker  . Smokeless tobacco: Never Used  . Alcohol Use: No   OB History    No data available     Review of Systems  Musculoskeletal: Positive for arthralgias.  All other systems reviewed and are negative.     Allergies  Other; Oxycodone; and Tomato  Home Medications   Prior to Admission medications   Medication Sig Start Date End Date Taking? Authorizing Provider  albuterol (PROVENTIL HFA;VENTOLIN HFA) 108 (90 BASE) MCG/ACT inhaler Inhale 2 puffs into the  lungs every 6 (six) hours as needed. Wheezing    Historical Provider, MD  cephALEXin (KEFLEX) 500 MG capsule Take 1 capsule (500 mg total) by mouth 2 (two) times daily. 10/27/13   Trixie DredgeEmily West, PA-C   BP 115/64 mmHg  Pulse 88  Temp(Src) 99.4 F (37.4 C) (Oral)  Resp 16  Ht 5\' 5"  (1.651 m)  Wt 120 lb (54.432 kg)  BMI 19.97 kg/m2  SpO2 100%  LMP 11/14/2013   Physical Exam  Constitutional: She is oriented to person, place, and time. She appears well-developed and well-nourished. No distress.  HENT:  Head: Normocephalic and atraumatic.  Mouth/Throat: Oropharynx is clear and moist.  Eyes: Conjunctivae and EOM are normal. Pupils are equal, round, and reactive to light.  Neck: Normal range of motion. Neck supple.  Cardiovascular: Normal rate, regular rhythm and normal heart sounds.   Pulmonary/Chest: Effort normal and breath sounds normal. No respiratory distress. She has no wheezes.  Musculoskeletal:       Right ankle: She exhibits decreased range of motion (due to pain). She exhibits no swelling, no ecchymosis, no deformity, no laceration and normal pulse. Tenderness. Lateral malleolus and medial malleolus tenderness found. Achilles tendon normal.  Neurological: She is alert and oriented to person, place, and time.  Skin: Skin is warm and dry. She is not diaphoretic.  Psychiatric: She has a normal mood and affect.  Nursing note and vitals  reviewed.   ED Course  Procedures (including critical care time) Labs Review Labs Reviewed - No data to display  Imaging Review Dg Ankle Complete Right  01/26/2014   CLINICAL DATA:  Slipped and fell. Ankle injury. Ankle pain. Initial encounter.  EXAM: RIGHT ANKLE - COMPLETE 3+ VIEW  COMPARISON:  None.  FINDINGS: There is no evidence of fracture, dislocation, or joint effusion. There is no evidence of arthropathy or other focal bone abnormality. Soft tissues are unremarkable.  IMPRESSION: Negative.   Electronically Signed   By: Myles RosenthalJohn  Stahl M.D.   On:  01/26/2014 12:09     EKG Interpretation None      MDM   Final diagnoses:  Ankle injury  Right ankle pain   28 y.o. F with slip and fall right ankle injury earlier this week.  No reported head injury or LOC.  No deformities or swelling noted on exam.  Foot NVI.  Will obtain screening x-ray, dose of tramadol given.  Imaging negative for acute fx or dislocation.  Foot remains NVI.  Will treat as sprain-- patient placed in ASO splint and crutches given.  Given orthopedic FU with if no improvement or if symptoms worsen.  Discussed plan with patient, he/she acknowledged understanding and agreed with plan of care.  Return precautions given for new or worsening symptoms.  Garlon HatchetLisa M Cloyce Paterson, PA-C 01/26/14 1332  Raeford RazorStephen Kohut, MD 01/27/14 [phone]

## 2014-01-31 IMAGING — CR DG CHEST 2V
2 series · 2 of 2 positions shown · non-contrast
Comparison: 07/19/2011

CLINICAL DATA: Dizziness, URI, asthma

CHEST - 2 VIEW

[w chest pa]
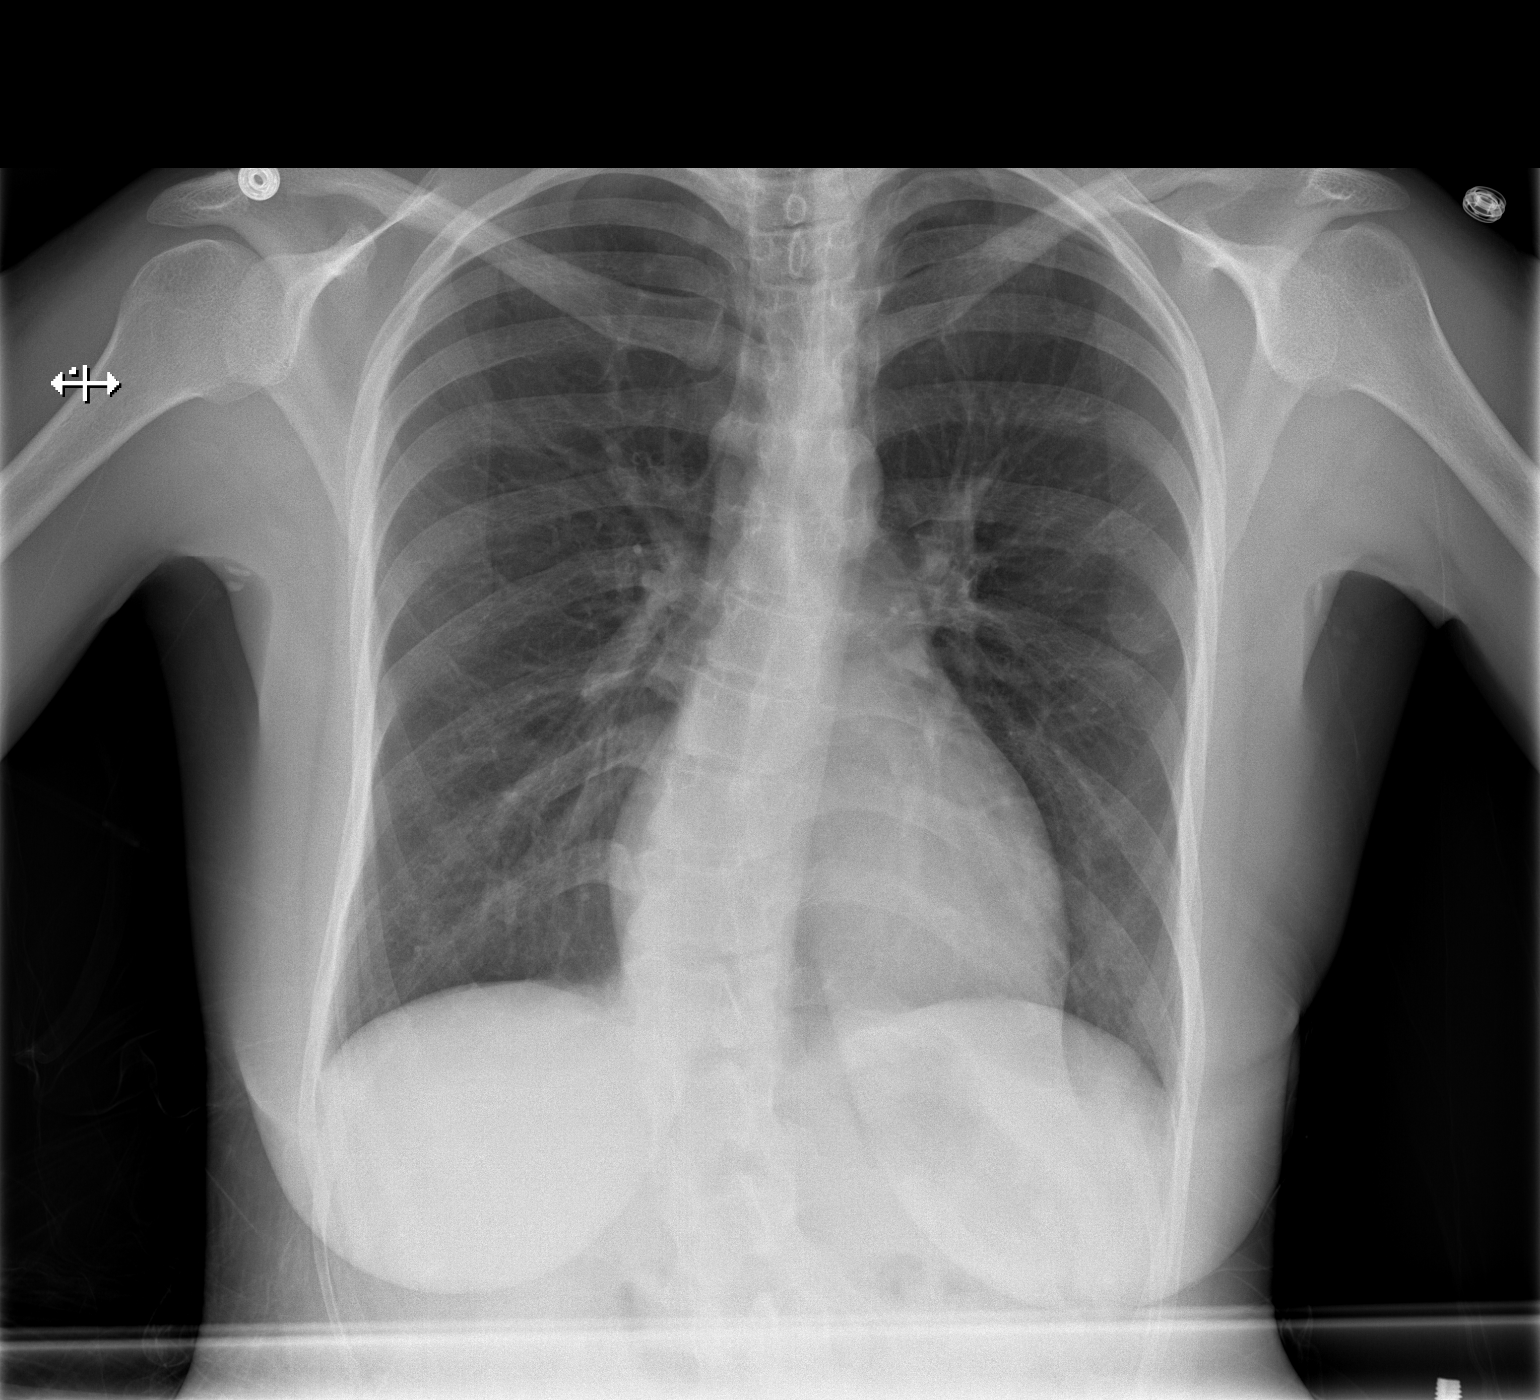

[w chest lat]
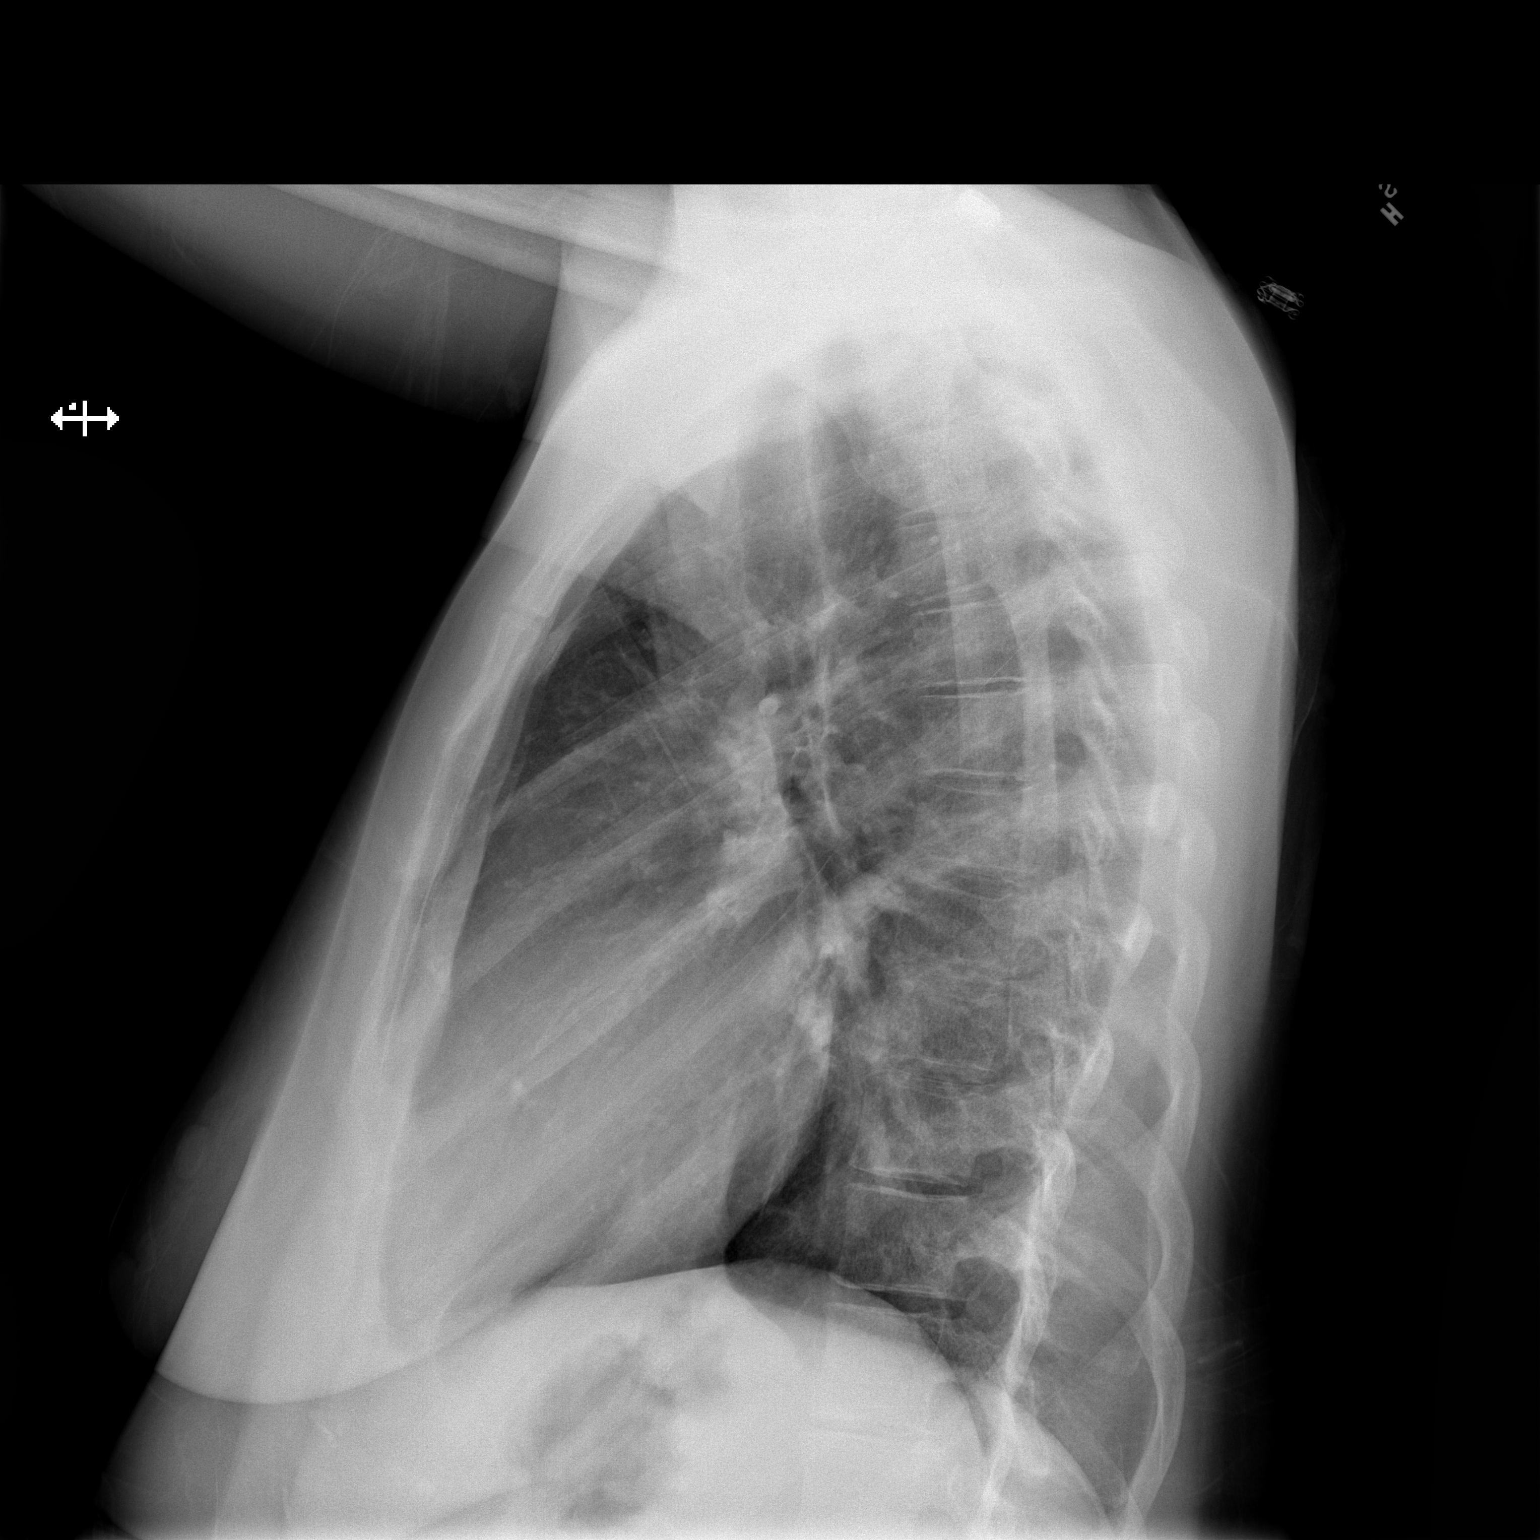

[2 of 2 positions shown; findings below may reference images not displayed]

FINDINGS: Biconvex thoracolumbar scoliosis.
Upper normal heart size.
Normal mediastinal contours and pulmonary vascularity.
Minimal chronic peribronchial thickening.
No pulmonary infiltrate, pleural effusion or pneumothorax.
No acute osseous findings.
IMPRESSION: Chronic peribronchial thickening of the pelvis and patient's
history of asthma.
No acute abnormalities.

## 2014-03-05 ENCOUNTER — Encounter (HOSPITAL_COMMUNITY): Payer: Self-pay | Admitting: Physical Medicine and Rehabilitation

## 2014-03-05 ENCOUNTER — Emergency Department (HOSPITAL_COMMUNITY)
Admission: EM | Admit: 2014-03-05 | Discharge: 2014-03-05 | Disposition: A | Discharge status: Home / Self Care | Payer: Medicaid Other | Attending: Emergency Medicine | Admitting: Emergency Medicine

## 2014-03-05 DIAGNOSIS — Z9851 Tubal ligation status: Secondary | ICD-10-CM | POA: Diagnosis not present

## 2014-03-05 DIAGNOSIS — M79604 Pain in right leg: Secondary | ICD-10-CM | POA: Insufficient documentation

## 2014-03-05 DIAGNOSIS — M545 Low back pain: Secondary | ICD-10-CM | POA: Diagnosis not present

## 2014-03-05 DIAGNOSIS — Z862 Personal history of diseases of the blood and blood-forming organs and certain disorders involving the immune mechanism: Secondary | ICD-10-CM | POA: Diagnosis not present

## 2014-03-05 DIAGNOSIS — J45909 Unspecified asthma, uncomplicated: Secondary | ICD-10-CM | POA: Diagnosis not present

## 2014-03-05 DIAGNOSIS — Z720 Tobacco use: Secondary | ICD-10-CM | POA: Insufficient documentation

## 2014-03-05 DIAGNOSIS — N390 Urinary tract infection, site not specified: Secondary | ICD-10-CM | POA: Insufficient documentation

## 2014-03-05 DIAGNOSIS — Z79899 Other long term (current) drug therapy: Secondary | ICD-10-CM | POA: Diagnosis not present

## 2014-03-05 DIAGNOSIS — Z3202 Encounter for pregnancy test, result negative: Secondary | ICD-10-CM | POA: Diagnosis not present

## 2014-03-05 DIAGNOSIS — M25551 Pain in right hip: Secondary | ICD-10-CM | POA: Diagnosis not present

## 2014-03-05 DIAGNOSIS — A5900 Urogenital trichomoniasis, unspecified: Secondary | ICD-10-CM | POA: Diagnosis not present

## 2014-03-05 DIAGNOSIS — R112 Nausea with vomiting, unspecified: Secondary | ICD-10-CM | POA: Diagnosis present

## 2014-03-05 DIAGNOSIS — A599 Trichomoniasis, unspecified: Secondary | ICD-10-CM

## 2014-03-05 DIAGNOSIS — E119 Type 2 diabetes mellitus without complications: Secondary | ICD-10-CM | POA: Diagnosis not present

## 2014-03-05 DIAGNOSIS — Z8659 Personal history of other mental and behavioral disorders: Secondary | ICD-10-CM | POA: Insufficient documentation

## 2014-03-05 LAB — CBC WITH DIFFERENTIAL/PLATELET
BASOS ABS: 0 10*3/uL (ref 0.0–0.1)
Basophils Relative: 0 % (ref 0–1)
Eosinophils Absolute: 0.1 10*3/uL (ref 0.0–0.7)
Eosinophils Relative: 1 % (ref 0–5)
HCT: 38.8 % (ref 36.0–46.0)
HEMOGLOBIN: 13.7 g/dL (ref 12.0–15.0)
Lymphocytes Relative: 14 % (ref 12–46)
Lymphs Abs: 1.6 10*3/uL (ref 0.7–4.0)
MCH: 30.2 pg (ref 26.0–34.0)
MCHC: 35.3 g/dL (ref 30.0–36.0)
MCV: 85.5 fL (ref 78.0–100.0)
Monocytes Absolute: 0.9 10*3/uL (ref 0.1–1.0)
Monocytes Relative: 8 % (ref 3–12)
NEUTROS ABS: 9.1 10*3/uL — AB (ref 1.7–7.7)
Neutrophils Relative %: 77 % (ref 43–77)
Platelets: 391 10*3/uL (ref 150–400)
RBC: 4.54 MIL/uL (ref 3.87–5.11)
RDW: 13.8 % (ref 11.5–15.5)
WBC: 11.7 10*3/uL — AB (ref 4.0–10.5)

## 2014-03-05 LAB — URINALYSIS, ROUTINE W REFLEX MICROSCOPIC
Bilirubin Urine: NEGATIVE
Glucose, UA: NEGATIVE mg/dL
Ketones, ur: >80 mg/dL — AB
NITRITE: POSITIVE — AB
Protein, ur: 30 mg/dL — AB
SPECIFIC GRAVITY, URINE: 1.018 (ref 1.005–1.030)
Urobilinogen, UA: 1 mg/dL (ref 0.0–1.0)
pH: 6 (ref 5.0–8.0)

## 2014-03-05 LAB — COMPREHENSIVE METABOLIC PANEL
ALBUMIN: 3.6 g/dL (ref 3.5–5.2)
ALK PHOS: 78 U/L (ref 39–117)
ALT: 8 U/L (ref 0–35)
AST: 20 U/L (ref 0–37)
Anion gap: 13 (ref 5–15)
BILIRUBIN TOTAL: 1 mg/dL (ref 0.3–1.2)
BUN: 7 mg/dL (ref 6–23)
CHLORIDE: 101 meq/L (ref 96–112)
CO2: 25 mmol/L (ref 19–32)
Calcium: 9.1 mg/dL (ref 8.4–10.5)
Creatinine, Ser: 0.88 mg/dL (ref 0.50–1.10)
GFR calc Af Amer: >90 mL/min (ref >90)
GFR, EST NON AFRICAN AMERICAN: 88 mL/min — AB (ref >90)
GLUCOSE: 88 mg/dL (ref 70–99)
Potassium: 3.5 mmol/L (ref 3.5–5.1)
Sodium: 139 mmol/L (ref 135–145)
TOTAL PROTEIN: 7.9 g/dL (ref 6.0–8.3)

## 2014-03-05 LAB — URINE MICROSCOPIC-ADD ON

## 2014-03-05 LAB — RETICULOCYTES
RBC.: 4.54 MIL/uL (ref 3.87–5.11)
Retic Count, Absolute: 40.9 10*3/uL (ref 19.0–186.0)
Retic Ct Pct: 0.9 % (ref 0.4–3.1)

## 2014-03-05 LAB — POC URINE PREG, ED: Preg Test, Ur: NEGATIVE

## 2014-03-05 LAB — LIPASE, BLOOD: Lipase: 20 U/L (ref 11–59)

## 2014-03-05 MED ORDER — ONDANSETRON HCL 4 MG/2ML IJ SOLN
4.0000 mg | Freq: Once | INTRAMUSCULAR | Status: AC
  Administered 2014-03-05: 4 mg via INTRAVENOUS
  Filled 2014-03-05: qty 2

## 2014-03-05 MED ORDER — ONDANSETRON HCL 4 MG/2ML IJ SOLN
4.0000 mg | Freq: Once | INTRAMUSCULAR | Status: AC
Start: 2014-03-05 — End: 2014-03-05
  Administered 2014-03-05: 4 mg via INTRAVENOUS
  Filled 2014-03-05: qty 2

## 2014-03-05 MED ORDER — CEPHALEXIN 500 MG PO CAPS: 500.0000 mg | ORAL_CAPSULE | Freq: Four times a day (QID) | ORAL | Status: DC

## 2014-03-05 MED ORDER — KETOROLAC TROMETHAMINE 30 MG/ML IJ SOLN
30.0000 mg | Freq: Once | INTRAMUSCULAR | Status: AC
  Administered 2014-03-05: 30 mg via INTRAVENOUS
  Filled 2014-03-05: qty 1

## 2014-03-05 MED ORDER — HYDROMORPHONE HCL 1 MG/ML IJ SOLN
1.0000 mg | Freq: Once | INTRAMUSCULAR | Status: AC
  Administered 2014-03-05: 1 mg via INTRAVENOUS
  Filled 2014-03-05: qty 1

## 2014-03-05 MED ORDER — SODIUM CHLORIDE 0.9 % IV BOLUS (SEPSIS)
1000.0000 mL | Freq: Once | INTRAVENOUS | Status: AC
  Administered 2014-03-05: 1000 mL via INTRAVENOUS

## 2014-03-05 MED ORDER — SODIUM CHLORIDE 0.9 % IV SOLN
INTRAVENOUS | Status: DC
  Administered 2014-03-05: 125 mL/h via INTRAVENOUS

## 2014-03-05 MED ORDER — CEFTRIAXONE SODIUM 1 G IJ SOLR
1.0000 g | Freq: Once | INTRAMUSCULAR | Status: AC
  Administered 2014-03-05: 1 g via INTRAMUSCULAR
  Filled 2014-03-05: qty 10

## 2014-03-05 MED ORDER — TRAMADOL HCL 50 MG PO TABS: 50.0000 mg | ORAL_TABLET | Freq: Four times a day (QID) | ORAL | Status: DC | PRN

## 2014-03-05 MED ORDER — LIDOCAINE HCL (PF) 1 % IJ SOLN
INTRAMUSCULAR | Status: AC
  Administered 2014-03-05: 2.1 mL
  Filled 2014-03-05: qty 5

## 2014-03-05 MED ORDER — METRONIDAZOLE 500 MG PO TABS
2000.0000 mg | ORAL_TABLET | Freq: Once | ORAL | Status: AC
  Administered 2014-03-05: 2000 mg via ORAL
  Filled 2014-03-05: qty 4

## 2014-03-05 NOTE — ED Provider Notes (Signed)
CSN: [id number]     Arrival date & time 03/05/14  1424 History   First MD Initiated Contact with Patient 03/05/14 1455     Chief Complaint  Patient presents with  . Sickle Cell Pain Crisis  . Emesis     (Consider location/radiation/quality/duration/timing/severity/associated sxs/prior Treatment) HPI Comments: 29 year old female with a past medical history of asthma, sickle cell anemia, bipolar disorder and diabetes presenting complaining of sickle cell pain. Patient reports over the past 5-6 days she's been experiencing right-sided low back pain, hip pain and leg pain. Pain described as sharp, 10/10, unrelieved by Tylenol and ibuprofen. No injury or trauma. States this is the same area of her typical sickle cell pain, however pain is more intense. She is also complaining of lower abdominal pain 5 days with associated nausea and vomiting. States she has been unable to keep anything down. Denies diarrhea. Denies any urinary or GYN symptoms. Denies fever, chills or chest pain.  Patient is a 29 y.o. female presenting with sickle cell pain and vomiting. The history is provided by the patient.  Sickle Cell Pain Crisis Associated symptoms: nausea and vomiting   Emesis Associated symptoms: abdominal pain     Past Medical History  Diagnosis Date  . Scoliosis   . Sickle cell trait   . Hx of tubal ligation   . Asthma   . Sickle cell anemia   . Bipolar disorder   . Diabetes mellitus without complication    Past Surgical History  Procedure Laterality Date  . Tubal ligation     No family history on file. History  Substance Use Topics  . Smoking status: Current Every Day Smoker    Types: Cigarettes  . Smokeless tobacco: Never Used  . Alcohol Use: No   OB History    No data available     Review of Systems  Gastrointestinal: Positive for nausea, vomiting and abdominal pain.  Musculoskeletal: Positive for back pain.       + R hip and leg pain.  All other systems reviewed and are  negative.     Allergies  Other; Oxycodone; and Tomato  Home Medications   Prior to Admission medications   Medication Sig Start Date End Date Taking? Authorizing Provider  albuterol (PROVENTIL HFA;VENTOLIN HFA) 108 (90 BASE) MCG/ACT inhaler Inhale 2 puffs into the lungs every 6 (six) hours as needed. Wheezing    Historical Provider, MD  cephALEXin (KEFLEX) 500 MG capsule Take 1 capsule (500 mg total) by mouth 2 (two) times daily. Patient not taking: Reported on 01/26/2014 10/27/13   Trixie DredgeEmily West, PA-C  traMADol (ULTRAM) 50 MG tablet Take 1 tablet (50 mg total) by mouth every 6 (six) hours as needed. 01/26/14   Garlon HatchetLisa M Sanders, PA-C   BP 96/52 mmHg  Pulse 86  Temp(Src) 97.8 F (36.6 C) (Oral)  Resp 20  SpO2 94% Physical Exam  Constitutional: She is oriented to person, place, and time. She appears well-developed and well-nourished. No distress.  HENT:  Head: Normocephalic and atraumatic.  Mouth/Throat: Oropharynx is clear and moist.  Dry MM.  Eyes: Conjunctivae and EOM are normal. Pupils are equal, round, and reactive to light. No scleral icterus.  Neck: Normal range of motion. Neck supple.  Cardiovascular: Normal rate, regular rhythm and normal heart sounds.   Pulmonary/Chest: Effort normal and breath sounds normal. No respiratory distress.  Abdominal: Soft. Bowel sounds are normal.  Mild tenderness across lower abdomen. No rigidity, guarding or rebound. No peritoneal signs. No CVAT.  Musculoskeletal:  Normal range of motion. She exhibits no edema.  TTP right SI, lateral hip and down right leg. No edema or erythema. FROM right hip, knee ankle.  Neurological: She is alert and oriented to person, place, and time. No sensory deficit.  Skin: Skin is warm and dry.  Psychiatric: She has a normal mood and affect. Her behavior is normal.  Nursing note and vitals reviewed.   ED Course  Procedures (including critical care time) Labs Review Labs Reviewed  RETICULOCYTES  CBC WITH  DIFFERENTIAL  COMPREHENSIVE METABOLIC PANEL  LIPASE, BLOOD  URINALYSIS, ROUTINE W REFLEX MICROSCOPIC  POC URINE PREG, ED    Imaging Review No results found.   EKG Interpretation None      MDM   Final diagnoses:  None   Pt in NAD. Non-toxic appearing. Sickle cell pain similar to pain in past, more intense. Regarding abdominal pain, tenderness of lower abdomen without peritoneal signs. Possible viral illness. Doubt acute chest, no CP. Labs, UA pending. Pt receiving IV fluids, pain/nausea meds.  Hgb stable, no change in retic count. Cmp, lipase, UA pending. Pt signed out to [employer], PA-C at shift change.  Kathrynn Speed, PA-C 03/05/14 1610  Toy Baker, MD 03/06/14 1003

## 2014-03-05 NOTE — Discharge Instructions (Signed)
Urinary Tract Infection Urinary tract infections (UTIs) can develop anywhere along your urinary tract. Your urinary tract is your body's drainage system for removing wastes and extra water. Your urinary tract includes two kidneys, two ureters, a bladder, and a urethra. Your kidneys are a pair of bean-shaped organs. Each kidney is about the size of your fist. They are located below your ribs, one on each side of your spine. CAUSES Infections are caused by microbes, which are microscopic organisms, including fungi, viruses, and bacteria. These organisms are so small that they can only be seen through a microscope. Bacteria are the microbes that most commonly cause UTIs. SYMPTOMS  Symptoms of UTIs may vary by age and gender of the patient and by the location of the infection. Symptoms in young women typically include a frequent and intense urge to urinate and a painful, burning feeling in the bladder or urethra during urination. Older women and men are more likely to be tired, shaky, and weak and have muscle aches and abdominal pain. A fever may mean the infection is in your kidneys. Other symptoms of a kidney infection include pain in your back or sides below the ribs, nausea, and vomiting. DIAGNOSIS To diagnose a UTI, your caregiver will ask you about your symptoms. Your caregiver also will ask to provide a urine sample. The urine sample will be tested for bacteria and white blood cells. White blood cells are made by your body to help fight infection. TREATMENT  Typically, UTIs can be treated with medication. Because most UTIs are caused by a bacterial infection, they usually can be treated with the use of antibiotics. The choice of antibiotic and length of treatment depend on your symptoms and the type of bacteria causing your infection. HOME CARE INSTRUCTIONS  If you were prescribed antibiotics, take them exactly as your caregiver instructs you. Finish the medication even if you feel better after you  have only taken some of the medication.  Drink enough water and fluids to keep your urine clear or pale yellow.  Avoid caffeine, tea, and carbonated beverages. They tend to irritate your bladder.  Empty your bladder often. Avoid holding urine for long periods of time.  Empty your bladder before and after sexual intercourse.  After a bowel movement, women should cleanse from front to back. Use each tissue only once. SEEK MEDICAL CARE IF:   You have back pain.  You develop a fever.  Your symptoms do not begin to resolve within 3 days. SEEK IMMEDIATE MEDICAL CARE IF:   You have severe back pain or lower abdominal pain.  You develop chills.  You have nausea or vomiting.  You have continued burning or discomfort with urination. MAKE SURE YOU:   Understand these instructions.  Will watch your condition.  Will get help right away if you are not doing well or get worse. Document Released: 11/11/2004 Document Revised: 08/03/2011 Document Reviewed: 03/12/2011 [facility] Patient Information 2015 [place], [place]. This information is not intended to replace advice given to you by your health care provider. Make sure you discuss any questions you have with your health care provider.  Trichomoniasis Trichomoniasis is an infection caused by an organism called Trichomonas. The infection can affect both women and men. In women, the outer female genitalia and the vagina are affected. In men, the penis is mainly affected, but the prostate and other reproductive organs can also be involved. Trichomoniasis is a sexually transmitted infection (STI) and is most often passed to another person through sexual contact.  RISK FACTORS  Having unprotected sexual intercourse.  Having sexual intercourse with an infected partner. SIGNS AND SYMPTOMS  Symptoms of trichomoniasis in women include:  Abnormal gray-green frothy vaginal discharge.  Itching and irritation of the vagina.  Itching and irritation  of the area outside the vagina. Symptoms of trichomoniasis in men include:   Penile discharge with or without pain.  Pain during urination. This results from inflammation of the urethra. DIAGNOSIS  Trichomoniasis may be found during a Pap test or physical exam. Your health care provider may use one of the following methods to help diagnose this infection:  Examining vaginal discharge under a microscope. For men, urethral discharge would be examined.  Testing the pH of the vagina with a test tape.  Using a vaginal swab test that checks for the Trichomonas organism. A test is available that provides results within a few minutes.  Doing a culture test for the organism. This is not usually needed. TREATMENT   You may be given medicine to fight the infection. Women should inform their health care provider if they could be or are pregnant. Some medicines used to treat the infection should not be taken during pregnancy.  Your health care provider may recommend over-the-counter medicines or creams to decrease itching or irritation.  Your sexual partner will need to be treated if infected. HOME CARE INSTRUCTIONS   Take medicines only as directed by your health care provider.  Take over-the-counter medicine for itching or irritation as directed by your health care provider.  Do not have sexual intercourse while you have the infection.  Women should not douche or wear tampons while they have the infection.  Discuss your infection with your partner. Your partner may have gotten the infection from you, or you may have gotten it from your partner.  Have your sex partner get examined and treated if necessary.  Practice safe, informed, and protected sex.  See your health care provider for other STI testing. SEEK MEDICAL CARE IF:   You still have symptoms after you finish your medicine.  You develop abdominal pain.  You have pain when you urinate.  You have bleeding after sexual  intercourse.  You develop a rash.  Your medicine makes you sick or makes you throw up (vomit). MAKE SURE YOU:  Understand these instructions.  Will watch your condition.  Will get help right away if you are not doing well or get worse. Document Released: 07/28/2000 Document Revised: 06/18/2013 Document Reviewed: 11/13/2012 [facility] Patient Information 2015 [place]. This information is not intended to replace advice given to you by your health care provider. Make sure you discuss any questions you have with your health care provider.   Emergency Department Resource Guide 1) Find a Doctor and Pay Out of Pocket Although you won't have to find out who is covered by your insurance plan, it is a good idea to ask around and get recommendations. You will then need to call the office and see if the doctor you have chosen will accept you as a new patient and what types of options they offer for patients who are self-pay. Some doctors offer discounts or will set up payment plans for their patients who do not have insurance, but you will need to ask so you aren't surprised when you get to your appointment.  2) Contact Your Local Health Department Not all health departments have doctors that can see patients for sick visits, but many do, so it is worth a call to see  if yours does. If you don't know where your local health department is, you can check in your phone book. The CDC also has a tool to help you locate your state's health department, and many state websites also have listings of all of their local health departments.  3) Find a Walk-in Clinic If your illness is not likely to be very severe or complicated, you may want to try a walk in clinic. These are popping up all over the country in pharmacies, drugstores, and shopping centers. They're usually staffed by nurse practitioners or physician assistants that have been trained to treat common illnesses and complaints. They're usually fairly  quick and inexpensive. However, if you have serious medical issues or chronic medical problems, these are probably not your best option.  No Primary Care Doctor: - Call Health Connect at  [phone] - they can help you locate a primary care doctor that  accepts your insurance, provides certain services, etc. - Physician Referral Service- [phone]  Chronic Pain Problems: Organization         Address  Phone   Notes  [facility]  [phone] Patients need to be referred by their primary care doctor.   Medication Assistance: Organization         Address  Phone   Notes  [facility] Medication [facility] [address] [place] [place], [place] [address] [phone] --Must be a resident of [facility] -- Must have NO insurance coverage whatsoever (no Medicaid/ Medicare, etc.) -- The pt. MUST have a primary care doctor that directs their care regularly and follows them in the community   Med[facility]ist  [phone]   [employer]  [phone]    Agencies that provide inexpensive medical care: Organization         Address  Phone   Notes  [facility]  [phone]   [facility]    [phone]   [facility] [address] [place], [place] [address] [phone]   [facility] [address] [place] [place] [phone]   [facility]anned Parenthood    [phone]   [facility]    [phone]   [facility]  [address], [place] Phone:  [phone], Fax:  [phone] Hours of Operation:  9 am - 6 pm, M-F.  Also accepts Medicaid/Medicare and self-pay.  [facility]  [address], [place] Phone: [phone], Fax: [phone]. Hours of Operation:  8:30 am - 5:30 pm, M-F.  Also accepts Medicaid and self-pay.  [facility] [place] [address] [place] Phone: [phone]    [facility] [address] Natasha Bence [place], [place] [phone], Ext. [phone] Mondays & Thursdays: 7-9 AM.  First 15 patients are seen on a first come, first serve basis.    Medicaid-accepting [facility] Providers:  Organization         Address  Phone   Notes  [facility] [address], [place] [phone] Also accepts self-pay patients.  [facility] [address] Laurell Josephs [place], [place]  [phone]   [facility] [address], [place] [phone]   [facility] [address], [place] [phone]   Renaye Rakers [address], [place]   [phone] Only accepts [place] Access Medicaid patients after they have their name applied to their card.   Self-Pay (no insurance) in [facility]:  Organization         Address  Phone   Notes  Sickle Cell Patients, [facility] [address] [place], [place] [phone]   [facility] [address] [place], [place] [phone]   [facility] [place]  [address], [place] [phone]   [facility]/Dr. Osei-Bonsu  [address], [place] or [address], [place] [phone] Phone number for both [place] and [place] locations is the same.  [facility] [address], [place] [phone]   [facility] [address], [place] or [address] [phone]   [facility] [address] [place] [phone], phone; [phone], fax Sees patients 1st and 3rd Saturday of every month.  Must not qualify for public or private insurance (i.e. Medicaid, Medicare, [place] Health Choice, Veterans' Benefits)  Household income should be no more than 200% of the poverty level The clinic cannot treat you if you are  pregnant or think you are pregnant  Sexually transmitted diseases are not treated at the clinic.    Dental Care: Organization         Address  Phone  Notes  [facility] [address] [place], [place] [phone] Accepts children up to age 95 who are enrolled in [place] or [place] Health Choice; pregnant women with a Medicaid card; and children who have applied for Medicaid or [place] Health Choice, but were declined, whose parents can pay a reduced fee at time of service.  [facility] of [facility]  [address], [place] [phone] Accepts children up to age 33 who are enrolled in [place] or [place] Health Choice; pregnant women with a Medicaid card; and children who have applied for Medicaid or  Health Choice, but were declined, whose parents can pay a reduced fee at time of service.  [facility]  [address] [place], [place] [phone] Patients are seen by appointment only. Walk-ins are not accepted. [facility] will see patients 59 years of age and older. Monday - Tuesday (8am-5pm) Most Wednesdays (8:30-5pm) $30 per visit, cash only  [facility]  [address], [facility] [phone] Patients are seen by appointment only. Walk-ins are not accepted. [facility] will see patients 12 years of age and older. One Wednesday Evening (Monthly: Volunteer Based).  $30 per visit, cash only  [employer]  [phone] for adults; Children under age 66, call Graduate Pediatric Dentistry at [phone]. Children aged 20-14, please call [phone] to request a pediatric application.  Dental services are provided in all areas of dental care including fillings, crowns and bridges, complete and partial dentures, implants, gum treatment, root canals, and extractions. Preventive care is also provided. Treatment is provided to  both adults and children. Patients are selected via a lottery and there is often a waiting list.   [facility] [address] [place]  [phone] [email or web address]   [facility] [address] [place], [place] [phone], Ext. [phone] Second and Fourth Thursday of each month, opens at 6:30  AM; Clinic ends at 9 AM.  Patients are seen on a first-come first-served basis, and a limited number are seen during each clinic.   [facility]  [address] Ether Griffins [place], [place] [phone]   Eligibility Requirements You must have lived in [place], [place], or [place] counties for at least the last three months.   You cannot be eligible for state or federal sponsored [place], including [employer], [place], or [employer].   You generally cannot be eligible for healthcare insurance through your employer.    How to apply: Eligibility screenings are held every Tuesday and Wednesday afternoon from 1:00 pm until 4:00 pm. You do not need an appointment for the interview!  [facility] [address] [place], [place] [phone]   [facility] Health Department  [phone]   [facility] Health Department  [phone]   [facility] Health Department  [phone]    Behavioral Health Resources in the Community: Intensive Outpatient Programs Organization         Address  Phone  Notes  [facility] [address] [place], [place] [phone]   [facility] Outpatient [address] [place], [place] [phone]   [facility] [address], [place], [place]  [phone]   [facility] Mental Health [address]  [place], [place] [phone] or [phone]   Substance Abuse Resources Organization         Address  Phone  Notes  [facility]  [phone]   [facility]  [phone]   [facility] [place]   [phone]   Floydene Flock  [phone]   Residential & Outpatient Substance Abuse Program  [phone]   Psychological Services Organization         Address  Phone  Notes  [facility] Behavioral Health  [phone]   [facility]  [phone]   [facility] [address], [place] [phone] or [phone]    Mobile Crisis Teams Organization         Address  Phone  Notes  [facility], Mobile Crisis Care Unit  [phone]   [facility]  [address] [place], [place] [phone]   Doristine Locks [address] [place] [phone]    Self-Help/Support Groups Organization         Address  Phone             Notes  [facility] of [place] - variety of support groups  336- I7437963 Call for more information  Narcotics Anonymous (NA), [facility] [address], [employer] [place]  2 meetings at this location   Statistician         Address  Phone  Notes  [facility] [address],    [place]  [phone]   [facility]  [address] [place] [address], [place], [place] [phone]   [facility] [address] [place], [place] [phone] Admissions: 8am-3pm M-F  [facility] [address] [place] [address],    [place], [place] [phone]   [facility] [address] Starling Manns [place], [place] [phone]   [facility] [address],  [place], [place] [phone]   [facility] - Intensive Outpatient [address]urell Josephs [address], [place], [place] [phone]   [facility] ([facility] [address] [place], [place] [phone] or [phone]  [facility] ([facility]) [address] [place], [place] [phone] Accepts Medicaid  [facility] [facility] [address]  [place] [phone] Substance Abuse/Addiction Treatment   [facility] Organization         Address  Phone  Notes  [facility]  [phone]   Angie Fava, PhD [address] Ervin Knack [place], [place]   6175779280 or [phone]   [facility] Behavioral   [address] [place], [place] [phone]   [facility] [address], [place], [place] [phone] Insurance/Medicaid/sponsorship through [facility] and Families [address]                                    [place], [place] [phone] Therapy/tele-psych/case  [facility] [address], [place] [phone]    Dr. Lolly Mustache  [phone]   [facility]  [employer] [facility] Dept. 1) [address], [place] 2) [address], [place] 3)  [address] [place], [place] [phone]  [phone]   [facility] Child Abuse Hotline (717)488-5768 or [phone] (After Hours)

## 2014-03-05 NOTE — ED Notes (Signed)
Pt presents to department for evaluation of sickle cell exacerbation and vomiting. Ongoing for several days. Pt reports pain to entire R side of body. Pt is alert and oriented x4.

## 2014-03-05 NOTE — ED Provider Notes (Signed)
Patient is a 29 year old female who presents emergency room for evaluation of right hip, right leg and right side pain. Patient was signed out to me at shift change. 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 Patient was given a 2 mg Dilaudid limits. Patient does appear to be in sickle cell crisis at this time per previous provider. If labs were normal patient was to be discharged home.  Physical Exam  BP 104/63 mmHg  Pulse 81  Temp(Src) 97.8 F (36.6 C) (Oral)  Resp 20  SpO2 99%  Physical Exam  Constitutional: She is oriented to person, place, and time. She appears well-developed and well-nourished. No distress.  HENT:  Head: Normocephalic and atraumatic.  Mouth/Throat: Oropharynx is clear and moist. No oropharyngeal exudate.  Eyes: Conjunctivae and EOM are normal. Pupils are equal, round, and reactive to light. No scleral icterus.  Neck: Normal range of motion. Neck supple. No JVD present. No thyromegaly present.  Cardiovascular: Normal rate, regular rhythm, normal heart sounds and intact distal pulses.  Exam reveals no gallop and no friction rub.   No murmur heard. Pulmonary/Chest: Effort normal and breath sounds normal. No respiratory distress. She has no wheezes. She has no rales. She exhibits no tenderness.  Abdominal: Soft. Bowel sounds are normal. She exhibits no distension and no mass. There is no tenderness. There is no rebound and no guarding.  Musculoskeletal: Normal range of motion.  Normal tenderness palpation of the right hip  Lymphadenopathy:    She has no cervical adenopathy.  Neurological: She is alert and oriented to person, place, and time.  Skin: Skin is warm and dry. She is not diaphoretic.  Psychiatric: She has a normal mood and affect. Her behavior is normal. Judgment and thought content normal.  Nursing note and vitals reviewed.   ED Course  Procedures  MDM Patient is 29 year old female who presents emergency room for evaluation of right sided pain  in her hip and leg and abdomen. Reticulocyte count is not elevated and appears to be not a sickle cell crisis. Hemoglobin stable. CMP is unremarkable. Urine pregnancy is negative. Urinalysis reveals Trichomonas in the urine and urinary tract infection that is positive nitrite and leukocyte. Patient given ceftriaxone here. She is also given 2 g of Flagyl. Pelvic exam was deferred. Patient follow-up with an OB/GYN of her choosing for testing for chlamydia and gonorrhea. Discharge home with Keflex and Ultram. Patient to return for difficulty with urinating, intractable fevers, or any other concerning symptoms. She states understanding and agreement at this time. Patient was able to walk with a steady gait and blood pressures have improved to 104/63. Patient given a dose of Zofran prior to leaving. Patient tolerating by mouth's. Patient discussed with Dr. Effie ShyWentz who agrees with the discharge and plan. Patient stable for discharge at this time.     Eben Burowourtney A Forcucci, PA-C 03/05/14 1916  Flint MelterElliott L [place], MD 03/05/14 2352

## 2014-03-08 ENCOUNTER — Encounter (HOSPITAL_COMMUNITY): Payer: Self-pay | Admitting: Emergency Medicine

## 2014-03-08 ENCOUNTER — Emergency Department (HOSPITAL_COMMUNITY)
Admission: EM | Admit: 2014-03-08 | Discharge: 2014-03-08 | Disposition: A | Discharge status: Home / Self Care | Payer: Medicaid Other | Attending: Emergency Medicine | Admitting: Emergency Medicine

## 2014-03-08 DIAGNOSIS — Z862 Personal history of diseases of the blood and blood-forming organs and certain disorders involving the immune mechanism: Secondary | ICD-10-CM | POA: Insufficient documentation

## 2014-03-08 DIAGNOSIS — Z3202 Encounter for pregnancy test, result negative: Secondary | ICD-10-CM | POA: Insufficient documentation

## 2014-03-08 DIAGNOSIS — M79604 Pain in right leg: Secondary | ICD-10-CM

## 2014-03-08 DIAGNOSIS — Y92009 Unspecified place in unspecified non-institutional (private) residence as the place of occurrence of the external cause: Secondary | ICD-10-CM | POA: Insufficient documentation

## 2014-03-08 DIAGNOSIS — W19XXXA Unspecified fall, initial encounter: Secondary | ICD-10-CM | POA: Diagnosis not present

## 2014-03-08 DIAGNOSIS — Z720 Tobacco use: Secondary | ICD-10-CM | POA: Insufficient documentation

## 2014-03-08 DIAGNOSIS — S8991XA Unspecified injury of right lower leg, initial encounter: Secondary | ICD-10-CM | POA: Insufficient documentation

## 2014-03-08 DIAGNOSIS — S0990XA Unspecified injury of head, initial encounter: Secondary | ICD-10-CM | POA: Insufficient documentation

## 2014-03-08 DIAGNOSIS — Z9851 Tubal ligation status: Secondary | ICD-10-CM | POA: Diagnosis not present

## 2014-03-08 DIAGNOSIS — Y9301 Activity, walking, marching and hiking: Secondary | ICD-10-CM | POA: Diagnosis not present

## 2014-03-08 DIAGNOSIS — J45909 Unspecified asthma, uncomplicated: Secondary | ICD-10-CM | POA: Diagnosis not present

## 2014-03-08 DIAGNOSIS — M419 Scoliosis, unspecified: Secondary | ICD-10-CM | POA: Diagnosis not present

## 2014-03-08 DIAGNOSIS — Y998 Other external cause status: Secondary | ICD-10-CM | POA: Insufficient documentation

## 2014-03-08 DIAGNOSIS — E119 Type 2 diabetes mellitus without complications: Secondary | ICD-10-CM | POA: Diagnosis not present

## 2014-03-08 DIAGNOSIS — Z8659 Personal history of other mental and behavioral disorders: Secondary | ICD-10-CM | POA: Insufficient documentation

## 2014-03-08 LAB — CBC WITH DIFFERENTIAL/PLATELET
BASOS ABS: 0 10*3/uL (ref 0.0–0.1)
Basophils Relative: 1 % (ref 0–1)
EOS PCT: 1 % (ref 0–5)
Eosinophils Absolute: 0.1 10*3/uL (ref 0.0–0.7)
HCT: 35.9 % — ABNORMAL LOW (ref 36.0–46.0)
Hemoglobin: 12.4 g/dL (ref 12.0–15.0)
Lymphocytes Relative: 40 % (ref 12–46)
Lymphs Abs: 3.3 10*3/uL (ref 0.7–4.0)
MCH: 29.5 pg (ref 26.0–34.0)
MCHC: 34.5 g/dL (ref 30.0–36.0)
MCV: 85.3 fL (ref 78.0–100.0)
Monocytes Absolute: 0.5 10*3/uL (ref 0.1–1.0)
Monocytes Relative: 6 % (ref 3–12)
Neutro Abs: 4.4 10*3/uL (ref 1.7–7.7)
Neutrophils Relative %: 52 % (ref 43–77)
Platelets: 451 10*3/uL — ABNORMAL HIGH (ref 150–400)
RBC: 4.21 MIL/uL (ref 3.87–5.11)
RDW: 13.7 % (ref 11.5–15.5)
WBC: 8.3 10*3/uL (ref 4.0–10.5)

## 2014-03-08 LAB — COMPREHENSIVE METABOLIC PANEL
ALT: 8 U/L (ref 0–35)
AST: 20 U/L (ref 0–37)
Albumin: 2.9 g/dL — ABNORMAL LOW (ref 3.5–5.2)
Alkaline Phosphatase: 70 U/L (ref 39–117)
Anion gap: 6 (ref 5–15)
BUN: <5 mg/dL — ABNORMAL LOW (ref 6–23)
CHLORIDE: 106 mmol/L (ref 96–112)
CO2: 27 mmol/L (ref 19–32)
Calcium: 8.6 mg/dL (ref 8.4–10.5)
Creatinine, Ser: 0.74 mg/dL (ref 0.50–1.10)
GFR calc Af Amer: >90 mL/min (ref >90)
GFR calc non Af Amer: >90 mL/min (ref >90)
GLUCOSE: 94 mg/dL (ref 70–99)
Potassium: 3.1 mmol/L — ABNORMAL LOW (ref 3.5–5.1)
Sodium: 139 mmol/L (ref 135–145)
TOTAL PROTEIN: 6.1 g/dL (ref 6.0–8.3)
Total Bilirubin: 0.3 mg/dL (ref 0.3–1.2)

## 2014-03-08 LAB — URINALYSIS, ROUTINE W REFLEX MICROSCOPIC
Bilirubin Urine: NEGATIVE
Glucose, UA: NEGATIVE mg/dL
Ketones, ur: NEGATIVE mg/dL
Nitrite: NEGATIVE
Protein, ur: NEGATIVE mg/dL
Specific Gravity, Urine: 1.012 (ref 1.005–1.030)
Urobilinogen, UA: 0.2 mg/dL (ref 0.0–1.0)
pH: 6 (ref 5.0–8.0)

## 2014-03-08 LAB — POC URINE PREG, ED: Preg Test, Ur: NEGATIVE

## 2014-03-08 LAB — URINE MICROSCOPIC-ADD ON

## 2014-03-08 LAB — RETICULOCYTES
RBC.: 4.21 MIL/uL (ref 3.87–5.11)
RETIC COUNT ABSOLUTE: 37.9 10*3/uL (ref 19.0–186.0)
RETIC CT PCT: 0.9 % (ref 0.4–3.1)

## 2014-03-08 MED ORDER — SODIUM CHLORIDE 0.9 % IV BOLUS (SEPSIS)
1000.0000 mL | INTRAVENOUS | Status: AC
  Administered 2014-03-08: 1000 mL via INTRAVENOUS

## 2014-03-08 MED ORDER — KETOROLAC TROMETHAMINE 30 MG/ML IJ SOLN
30.0000 mg | Freq: Once | INTRAMUSCULAR | Status: AC
  Administered 2014-03-08: 30 mg via INTRAVENOUS
  Filled 2014-03-08: qty 1

## 2014-03-08 MED ORDER — DIPHENHYDRAMINE HCL 50 MG/ML IJ SOLN
25.0000 mg | Freq: Once | INTRAMUSCULAR | Status: AC
  Administered 2014-03-08: 25 mg via INTRAVENOUS
  Filled 2014-03-08: qty 1

## 2014-03-08 MED ORDER — METOCLOPRAMIDE HCL 5 MG/ML IJ SOLN
5.0000 mg | Freq: Once | INTRAMUSCULAR | Status: AC
  Administered 2014-03-08: 5 mg via INTRAVENOUS
  Filled 2014-03-08: qty 2
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
## 2014-03-09 ENCOUNTER — Emergency Department (HOSPITAL_COMMUNITY)
Admission: EM | Admit: 2014-03-09 | Discharge: 2014-03-09 | Disposition: A | Discharge status: Home / Self Care | Payer: Medicaid Other | Attending: Emergency Medicine | Admitting: Emergency Medicine

## 2014-03-09 ENCOUNTER — Encounter (HOSPITAL_COMMUNITY): Payer: Self-pay | Admitting: Emergency Medicine

## 2014-03-09 DIAGNOSIS — T699XXA Effect of reduced temperature, unspecified, initial encounter: Secondary | ICD-10-CM | POA: Diagnosis present

## 2014-03-09 DIAGNOSIS — Z9851 Tubal ligation status: Secondary | ICD-10-CM | POA: Insufficient documentation

## 2014-03-09 DIAGNOSIS — Y9301 Activity, walking, marching and hiking: Secondary | ICD-10-CM | POA: Insufficient documentation

## 2014-03-09 DIAGNOSIS — X31XXXA Exposure to excessive natural cold, initial encounter: Secondary | ICD-10-CM | POA: Diagnosis not present

## 2014-03-09 DIAGNOSIS — Z8659 Personal history of other mental and behavioral disorders: Secondary | ICD-10-CM | POA: Insufficient documentation

## 2014-03-09 DIAGNOSIS — M419 Scoliosis, unspecified: Secondary | ICD-10-CM | POA: Diagnosis not present

## 2014-03-09 DIAGNOSIS — Y998 Other external cause status: Secondary | ICD-10-CM | POA: Diagnosis not present

## 2014-03-09 DIAGNOSIS — Y9289 Other specified places as the place of occurrence of the external cause: Secondary | ICD-10-CM | POA: Insufficient documentation

## 2014-03-09 DIAGNOSIS — M25571 Pain in right ankle and joints of right foot: Secondary | ICD-10-CM | POA: Diagnosis not present

## 2014-03-09 DIAGNOSIS — Z720 Tobacco use: Secondary | ICD-10-CM | POA: Diagnosis not present

## 2014-03-09 DIAGNOSIS — Z862 Personal history of diseases of the blood and blood-forming organs and certain disorders involving the immune mechanism: Secondary | ICD-10-CM | POA: Insufficient documentation

## 2014-03-09 DIAGNOSIS — E119 Type 2 diabetes mellitus without complications: Secondary | ICD-10-CM | POA: Diagnosis not present

## 2014-03-09 DIAGNOSIS — J45909 Unspecified asthma, uncomplicated: Secondary | ICD-10-CM | POA: Diagnosis not present

## 2014-03-09 MED ORDER — TRAMADOL HCL 50 MG PO TABS
50.0000 mg | ORAL_TABLET | Freq: Once | ORAL | Status: AC
  Administered 2014-03-09: 50 mg via ORAL
  Filled 2014-03-09: qty 1

## 2014-03-09 NOTE — ED Notes (Signed)
PA Parker at bedside. 
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
## 2014-03-09 NOTE — Discharge Instructions (Signed)
Call for a follow up appointment with a Family or Primary Care Provider.  °Return if Symptoms worsen.   °Take medication as prescribed.  ° ° °Emergency Department Resource Guide °1) Find a Doctor and Pay Out of Pocket °Although you won't have to find out who is covered by your insurance plan, it is a good idea to ask around and get recommendations. You will then need to call the office and see if the doctor you have chosen will accept you as a new patient and what types of options they offer for patients who are self-pay. Some doctors offer discounts or will set up payment plans for their patients who do not have insurance, but you will need to ask so you aren't surprised when you get to your appointment. ° °2) Contact Your Local Health Department °Not all health departments have doctors that can see patients for sick visits, but many do, so it is worth a call to see if yours does. If you don't know where your local health department is, you can check in your phone book. The CDC also has a tool to help you locate your state's health department, and many state websites also have listings of all of their local health departments. ° °3) Find a Walk-in Clinic °If your illness is not likely to be very severe or complicated, you may want to try a walk in clinic. These are popping up all over the country in pharmacies, drugstores, and shopping centers. They're usually staffed by nurse practitioners or physician assistants that have been trained to treat common illnesses and complaints. They're usually fairly quick and inexpensive. However, if you have serious medical issues or chronic medical problems, these are probably not your best option. ° °No Primary Care Doctor: °- Call [facility] at  832-8000 - they can help you locate a primary care doctor that  accepts your insurance, provides certain services, etc. °- Physician Referral Service- 1-800-533-3463 ° °Chronic Pain Problems: °Organization         Address  Phone    Notes  °[facility]  (336) 297-2271 Patients need to be referred by their primary care doctor.  ° °Medication Assistance: °Organization         Address  Phone   Notes  °[place] [facility] [address] °[place], [place] [address] (336) 641-8030 --Must be a resident of [place] °-- Must have NO insurance coverage whatsoever (no Medicaid/ Medicare, etc.) °-- The pt. MUST have a primary care doctor that directs their care regularly and follows them in the community °  °[facility]  (866) 331-1348   °[employer]  (888) 892-1162   ° °Agencies that provide inexpensive medical care: °Organization         Address  Phone   Notes  °[facility]  (336) 832-8035   ° Internal Medicine    (336) 832-7272   °[facility] [address] °[place], [place] [address] (336) 832-4777   °[facility] [address], °[place] (336) 271-4999   °[facility]    (336) 373-0678   °[facility]    (336) 272-1050   °[facility] ° [address], [place] Phone:  (336) 832-4444, Fax:  (336) 832-4440 Hours of Operation:  9 am - 6 pm, M-F.  Also accepts Medicaid/Medicare and self-pay.  °[facility] ° [address], [place]   Phone: (336) 832-3150, Fax: (336) 832-3151. Hours of Operation:  8:30 am - 5:30 pm, M-F.  Also accepts Medicaid and self-pay.  °[facility] [place] [address], [place] Phone: (336) 878-6027   °[facility] [address], [place], [place] (336)723-1848, Ext. 123 Mondays & Thursdays: 7-9 AM.  First 15 patients are seen on a first come, first serve basis. °  ° °Medicaid-accepting [place] Providers: ° °Organization         Address  Phone   Notes  °[facility] [address], [place] (336) 641-2100 Also accepts self-pay patients.  °[facility]  [address], [place] ° (336) 856-9996   °[facility] [address], [place] (336) 288-8857   °[facility] [address] [place] [address], [place] (336) 299-7000   °Veita Bland [address], [place]  ° (336) 373-1557 Only accepts [place] Access Medicaid patients after they have their name applied to their card.  ° °Self-Pay (no insurance) in [place]: ° °Organization         Address  Phone   Notes  °Sickle Cell Patients, [facility] [address], [place] (336) 832-1970   °[facility] [address], [place] (336) 832-4400   °[facility] ° [address], [place] (336) 992-4800   °[facility]/Dr. Osei-Bonsu ° [address] [place] [address], [place] or [address], [place] (336) 841-8500 Phone number for both [place] and [place] locations is the same.  °[facility] [address], [place] (336) 299-0000   °[facility] [address] [place] [address], [place] or [address] (336) 852-7530 °(336) 878-2260   °[facility] [address], [place] (336) 350-1642, phone; (336) 294-5005, fax Sees patients 1st and 3rd Saturday of every month.  Must not qualify for public or private insurance (i.e. Medicaid, Medicare, [place] Health Choice, Veterans' Benefits) • Household income should be no more than 200% of the poverty level •The clinic cannot treat you if you are pregnant or think you are pregnant • Sexually transmitted diseases are not treated at the clinic.  ° ° °Dental Care: °Organization         Address  Phone  Notes  °[place] [facility] [address], [place] (336) 641-6152 Accepts children up to age 21 who are enrolled in Medicaid or [place] Health Choice; pregnant women with a Medicaid card; and children who have  applied for Medicaid or [place] Health Choice, but were declined, whose parents can pay a reduced fee at time of service.  °[facility] [place]  [address], [place] (336) 641-7733 Accepts children up to age 21 who are enrolled in Medicaid or [place] Health Choice; pregnant women with a Medicaid card; and children who have applied for Medicaid or [place] Health Choice, but were declined, whose parents can pay a reduced fee at time of service.  °[facility] ° [address], [place] (336) 641-4533 Patients are seen by appointment only. Walk-ins are not accepted. [facility] will see patients 18 years of age and older. °Monday - Tuesday (8am-5pm) °Most Wednesdays (8:30-5pm) °$30 per visit, cash only  °[facility] ° [address], [place] (336) 641-4533 Patients are seen by appointment only. Walk-ins are not accepted. [facility] will see patients 18 years of age and older. °One Wednesday Evening (Monthly: Volunteer Based).  $30 per visit, cash only  °[facility]  (919) 537-3737 for adults; Children under age 4, call Graduate Pediatric Dentistry at (919) [phone]. Children aged 4-14, please call (919) [phone] to request a pediatric application. ° Dental services are provided in all areas of dental care including fillings, crowns and bridges, complete and partial dentures, implants, gum treatment, root canals, and extractions. Preventive care is also provided. Treatment is provided to both adults and children. °Patients are selected via a lottery and there is often a waiting list. °  °[facility] [address], °[place] ° (336) 763-8833 [email or web address] °  °[facility] [address], [place], [place] (336)723-1848, Ext. 123 Second and Fourth Thursday of each month, opens at 6:30 AM; Clinic ends at 9 AM.  Patients are seen on a first-come first-served basis, and a  limited number are seen during each clinic.  ° °[facility] ° [address], [place], [place] (336) 723-7904   Eligibility Requirements °You must have lived in [place], [place], or [place] counties for at least the last three months. °  You cannot be eligible for state or federal sponsored healthcare insurance, including Veterans Administration, Medicaid, or Medicare. °  You generally cannot be eligible for healthcare insurance through your employer.  °  How to apply: °Eligibility screenings are held every Tuesday and Wednesday afternoon from 1:00 pm until 4:00 pm. You do not need an appointment for the interview!  °[facility] [address], [place], [place] 336-631-2330   °[facility]  336-342-8273   °[facility]  336-703-3100   °[facility]  336-570-6415   ° °Behavioral Health Resources in the Community: °Intensive Outpatient Programs °Organization         Address  Phone  Notes  °[facility] [address], [place], [place] 336-878-6098   °[facility] [address], [place], [place] 336-832-9800   °[facility] [address], [place], [place] ° 336-882-2125   °[facility] [address],  °[place], [place] 1-800-853-5163 or 336-641-4981   °Substance Abuse Resources °Organization         Address  Phone  Notes  °[facility]  336-882-2125   °[facility]  336-784-9470   °[facility]  336-285-9073   °[facility]  336-845-3988   °Residential & Outpatient Substance Abuse Program  1-800-659-3381   °Psychological Services °Organization         Address  Phone  Notes  °[facility]  336- 832-9600   °[facility]  336- 378-7881   °[facility] [address], [place] 1-800-853-5163 or 336-641-4981   ° °Mobile Crisis Teams °Organization          Address  Phone  Notes  °[facility], Mobile Crisis Care Unit  1-877-626-1772   °Assertive °[facility] ° [address] [place], [place] 336-834-9664   °Sharon DeEsch [address] °[place]  336-554-5454   ° °Self-Help/Support Groups °Organization         Address  Phone             Notes  °[facility]  - variety of   support groups  336- 373-1402 Call for more information  °Narcotics Anonymous (NA), [facility] [address], °[place]  2 meetings at this location  ° °Residential Treatment Programs °Organization         Address  Phone  Notes  °[facility] [address],    °[place]  1-866-801-8205   °[facility] ° [address], [place], [place] 704-293-8524   °[facility] [address], [place] 336-845-3988 Admissions: 8am-3pm M-F  °[facility] [address]    °[place], [place] 336-841-1104   °[facility] [address], [place], [place] 336-379-7146   °[facility] [address]  °[place], [place] 336-285-9073   °[facility] Intensive Outpatient [address], [place], [place] 336-852-3033   °[facility] [address]  °[place], [place] 1-877-615-2722 or 336-784-9470   °[facility] [address] [place], [place] 336-227-7417 Accepts Medicaid  °[facility] [address]  °[place] 1-800-659-3381 Substance Abuse/Addiction Treatment  ° °[place] Behavioral Health Resources °Organization         Address  Phone  Notes  °[facility]  (888) 581-9988   °Julie Brannon, PhD [address] [place], [place]   (336) 349-5553 or (336) 951-0000   °[facility]   [address] °[place], [place] (336) 349-4454   °[facility] [address], [place], [place] (336) 342-8316 Insurance/Medicaid/sponsorship  through [facility]  °[facility] [address]                                    [place], [place] (336) 342-8316 Therapy/tele-psych/case  °[facility] [address]  ° [place], [place] (336) 349-2233    °Dr. Arfeen  (336) 349-4544   °[facility] [place]  [employer] [place] [facility] 1) [address], [place] °2) [address], [place] °3)  [address], [place] (336) 349-3220 °(336) 342-7768 ° °(336) 342-8140   °[place] [facility] (336) 342-1394 or (336) 342-3537 (After Hours)    ° °

## 2014-03-24 ENCOUNTER — Emergency Department (HOSPITAL_COMMUNITY)
Admission: EM | Admit: 2014-03-24 | Discharge: 2014-03-24 | Disposition: A | Discharge status: Home / Self Care | Payer: Medicaid Other | Attending: Emergency Medicine | Admitting: Emergency Medicine

## 2014-03-24 ENCOUNTER — Encounter (HOSPITAL_COMMUNITY): Payer: Self-pay | Admitting: Physical Medicine and Rehabilitation

## 2014-03-24 DIAGNOSIS — Z792 Long term (current) use of antibiotics: Secondary | ICD-10-CM | POA: Diagnosis not present

## 2014-03-24 DIAGNOSIS — R112 Nausea with vomiting, unspecified: Secondary | ICD-10-CM | POA: Insufficient documentation

## 2014-03-24 DIAGNOSIS — Z720 Tobacco use: Secondary | ICD-10-CM | POA: Insufficient documentation

## 2014-03-24 DIAGNOSIS — R569 Unspecified convulsions: Secondary | ICD-10-CM | POA: Diagnosis present

## 2014-03-24 DIAGNOSIS — Z9851 Tubal ligation status: Secondary | ICD-10-CM | POA: Diagnosis not present

## 2014-03-24 DIAGNOSIS — M419 Scoliosis, unspecified: Secondary | ICD-10-CM | POA: Insufficient documentation

## 2014-03-24 DIAGNOSIS — G40909 Epilepsy, unspecified, not intractable, without status epilepticus: Secondary | ICD-10-CM | POA: Insufficient documentation

## 2014-03-24 DIAGNOSIS — Z862 Personal history of diseases of the blood and blood-forming organs and certain disorders involving the immune mechanism: Secondary | ICD-10-CM | POA: Diagnosis not present

## 2014-03-24 DIAGNOSIS — E119 Type 2 diabetes mellitus without complications: Secondary | ICD-10-CM | POA: Insufficient documentation

## 2014-03-24 DIAGNOSIS — F319 Bipolar disorder, unspecified: Secondary | ICD-10-CM | POA: Insufficient documentation

## 2014-03-24 DIAGNOSIS — Z3202 Encounter for pregnancy test, result negative: Secondary | ICD-10-CM | POA: Diagnosis not present

## 2014-03-24 DIAGNOSIS — Z79899 Other long term (current) drug therapy: Secondary | ICD-10-CM | POA: Diagnosis not present

## 2014-03-24 DIAGNOSIS — J45909 Unspecified asthma, uncomplicated: Secondary | ICD-10-CM | POA: Insufficient documentation

## 2014-03-24 LAB — RAPID URINE DRUG SCREEN, HOSP PERFORMED
AMPHETAMINES: NOT DETECTED
BENZODIAZEPINES: NOT DETECTED
Barbiturates: NOT DETECTED
Cocaine: NOT DETECTED
Opiates: NOT DETECTED
Tetrahydrocannabinol: POSITIVE — AB

## 2014-03-24 LAB — COMPREHENSIVE METABOLIC PANEL
ALBUMIN: 3.6 g/dL (ref 3.5–5.2)
ALT: 9 U/L (ref 0–35)
AST: 21 U/L (ref 0–37)
Alkaline Phosphatase: 69 U/L (ref 39–117)
Anion gap: 5 (ref 5–15)
BUN: 12 mg/dL (ref 6–23)
CALCIUM: 8.9 mg/dL (ref 8.4–10.5)
CO2: 28 mmol/L (ref 19–32)
CREATININE: 0.97 mg/dL (ref 0.50–1.10)
Chloride: 109 mmol/L (ref 96–112)
GFR calc Af Amer: >90 mL/min (ref >90)
GFR, EST NON AFRICAN AMERICAN: 78 mL/min — AB (ref >90)
GLUCOSE: 91 mg/dL (ref 70–99)
Potassium: 4.1 mmol/L (ref 3.5–5.1)
SODIUM: 142 mmol/L (ref 135–145)
Total Bilirubin: 0.6 mg/dL (ref 0.3–1.2)
Total Protein: 7 g/dL (ref 6.0–8.3)

## 2014-03-24 LAB — CBC WITH DIFFERENTIAL/PLATELET
BASOS ABS: 0 10*3/uL (ref 0.0–0.1)
Basophils Relative: 0 % (ref 0–1)
EOS ABS: 0.1 10*3/uL (ref 0.0–0.7)
EOS PCT: 1 % (ref 0–5)
HEMATOCRIT: 39.3 % (ref 36.0–46.0)
Hemoglobin: 13.3 g/dL (ref 12.0–15.0)
LYMPHS ABS: 1.5 10*3/uL (ref 0.7–4.0)
LYMPHS PCT: 25 % (ref 12–46)
MCH: 29.4 pg (ref 26.0–34.0)
MCHC: 33.8 g/dL (ref 30.0–36.0)
MCV: 86.9 fL (ref 78.0–100.0)
Monocytes Absolute: 0.5 10*3/uL (ref 0.1–1.0)
Monocytes Relative: 8 % (ref 3–12)
Neutro Abs: 4.1 10*3/uL (ref 1.7–7.7)
Neutrophils Relative %: 66 % (ref 43–77)
Platelets: 313 10*3/uL (ref 150–400)
RBC: 4.52 MIL/uL (ref 3.87–5.11)
RDW: 14.7 % (ref 11.5–15.5)
WBC: 6.2 10*3/uL (ref 4.0–10.5)

## 2014-03-24 LAB — URINALYSIS, ROUTINE W REFLEX MICROSCOPIC
BILIRUBIN URINE: NEGATIVE
GLUCOSE, UA: NEGATIVE mg/dL
HGB URINE DIPSTICK: NEGATIVE
KETONES UR: NEGATIVE mg/dL
Nitrite: NEGATIVE
PH: 8 (ref 5.0–8.0)
PROTEIN: NEGATIVE mg/dL
Specific Gravity, Urine: 1.015 (ref 1.005–1.030)
UROBILINOGEN UA: 0.2 mg/dL (ref 0.0–1.0)

## 2014-03-24 LAB — PREGNANCY, URINE: Preg Test, Ur: NEGATIVE

## 2014-03-24 LAB — URINE MICROSCOPIC-ADD ON

## 2014-03-24 MED ORDER — ONDANSETRON HCL 4 MG/2ML IJ SOLN
4.0000 mg | Freq: Once | INTRAMUSCULAR | Status: AC
  Administered 2014-03-24: 4 mg via INTRAVENOUS
  Filled 2014-03-24: qty 2

## 2014-03-24 MED ORDER — ACETAMINOPHEN 325 MG PO TABS
650.0000 mg | ORAL_TABLET | Freq: Once | ORAL | Status: AC
  Administered 2014-03-24: 650 mg via ORAL
  Filled 2014-03-24: qty 2

## 2014-03-24 MED ORDER — LEVETIRACETAM 500 MG PO TABS: 500.0000 mg | ORAL_TABLET | Freq: Two times a day (BID) | ORAL | Status: AC

## 2014-03-24 MED ORDER — LEVETIRACETAM 500 MG PO TABS
1000.0000 mg | ORAL_TABLET | Freq: Once | ORAL | Status: AC
  Administered 2014-03-24: 1000 mg via ORAL
  Filled 2014-03-24: qty 2

## 2014-03-24 MED ORDER — SODIUM CHLORIDE 0.9 % IV BOLUS (SEPSIS)
1000.0000 mL | Freq: Once | INTRAVENOUS | Status: AC
  Administered 2014-03-24: 1000 mL via INTRAVENOUS

## 2014-03-24 MED ORDER — ONDANSETRON 4 MG PO TBDP: 4.0000 mg | ORAL_TABLET | Freq: Three times a day (TID) | ORAL | Status: DC | PRN

## 2014-03-24 MED ORDER — SODIUM CHLORIDE 0.9 % IV SOLN: Freq: Once | INTRAVENOUS | Status: DC

## 2014-03-24 NOTE — ED Provider Notes (Signed)
CSN: [id number]     Arrival date & time 03/24/14  0908 History   First MD Initiated Contact with Patient 03/24/14 [phone]     Chief Complaint  Patient presents with  . Fatigue  . Seizures      HPI  Patient presents via EMS from a sitting part. She states that she was walking along with some friends when she ended up on the ground. She does not recall this. She states her friends told her that she had a "seizure" friends called paramedics. However friends "left" upon arrival of paramedics and patient was sitting on the park bench. She states she has had seizures in the past and is not currently medicated for that. She reports nausea and vomiting over the last several days.  Past Medical History  Diagnosis Date  . Scoliosis   . Sickle cell trait   . Hx of tubal ligation   . Asthma   . Sickle cell anemia   . Bipolar disorder   . Diabetes mellitus without complication    Past Surgical History  Procedure Laterality Date  . Tubal ligation     No family history on file. History  Substance Use Topics  . Smoking status: Current Every Day Smoker    Types: Cigarettes  . Smokeless tobacco: Never Used  . Alcohol Use: No   OB History    No data available     Review of Systems  Constitutional: Negative for fever, chills, diaphoresis, appetite change and fatigue.  HENT: Negative for mouth sores, sore throat and trouble swallowing.   Eyes: Negative for visual disturbance.  Respiratory: Negative for cough, chest tightness, shortness of breath and wheezing.   Cardiovascular: Negative for chest pain.  Gastrointestinal: Positive for nausea and vomiting. Negative for abdominal pain, diarrhea and abdominal distention.  Endocrine: Negative for polydipsia, polyphagia and polyuria.  Genitourinary: Negative for dysuria, frequency and hematuria.  Musculoskeletal: Negative for gait problem.  Skin: Negative for color change, pallor and rash.  Neurological: Positive for dizziness and seizures.  Negative for syncope, light-headedness and headaches.  Hematological: Does not bruise/bleed easily.  Psychiatric/Behavioral: Negative for behavioral problems and confusion.      Allergies  Other and Oxycodone  Home Medications   Prior to Admission medications   Medication Sig Start Date End Date Taking? Authorizing Provider  acetaminophen (TYLENOL) 325 MG tablet Take 650 mg by mouth every 6 (six) hours as needed for moderate pain.    Yes Historical Provider, MD  albuterol (PROVENTIL HFA;VENTOLIN HFA) 108 (90 BASE) MCG/ACT inhaler Inhale 1-2 puffs into the lungs every 6 (six) hours as needed for wheezing or shortness of breath.   Yes Historical Provider, MD  ibuprofen (ADVIL,MOTRIN) 200 MG tablet Take 400 mg by mouth every 6 (six) hours as needed for moderate pain.   Yes Historical Provider, MD  cephALEXin (KEFLEX) 500 MG capsule Take 1 capsule (500 mg total) by mouth 4 (four) times daily. 03/05/14   Courtney A Forcucci, PA-C  levETIRAcetam (KEPPRA) 500 MG tablet Take 1 tablet (500 mg total) by mouth 2 (two) times daily. 03/24/14   Rolland Porter, MD  ondansetron (ZOFRAN ODT) 4 MG disintegrating tablet Take 1 tablet (4 mg total) by mouth every 8 (eight) hours as needed for nausea. 03/24/14   Rolland Porter, MD  traMADol (ULTRAM) 50 MG tablet Take 1 tablet (50 mg total) by mouth every 6 (six) hours as needed. Patient taking differently: Take 50 mg by mouth every 6 (six) hours as needed for moderate  pain.  03/05/14   Courtney A Forcucci, PA-C   BP 107/66 mmHg  Pulse 62  Temp(Src) 98.4 F (36.9 C) (Oral)  Resp 19  SpO2 100%  LMP 02/15/2014 (Approximate) Physical Exam  Constitutional: She is oriented to person, place, and time. She appears well-developed and well-nourished. No distress.  HENT:  Head: Normocephalic.  Eyes: Conjunctivae are normal. Pupils are equal, round, and reactive to light. No scleral icterus.  Neck: Normal range of motion. Neck supple. No thyromegaly present.  Cardiovascular:  Normal rate and regular rhythm.  Exam reveals no gallop and no friction rub.   No murmur heard. Pulmonary/Chest: Effort normal and breath sounds normal. No respiratory distress. She has no wheezes. She has no rales.  Abdominal: Soft. Bowel sounds are normal. She exhibits no distension. There is no tenderness. There is no rebound.  Musculoskeletal: Normal range of motion.  Neurological: She is alert and oriented to person, place, and time.  Skin: Skin is warm and dry. No rash noted.  Psychiatric: She has a normal mood and affect. Her behavior is normal.    ED Course  Procedures (including critical care time) Labs Review Labs Reviewed  COMPREHENSIVE METABOLIC PANEL - Abnormal; Notable for the following:    GFR calc non Af Amer 78 (*)    All other components within normal limits  URINALYSIS, ROUTINE W REFLEX MICROSCOPIC - Abnormal; Notable for the following:    Leukocytes, UA SMALL (*)    All other components within normal limits  URINE RAPID DRUG SCREEN (HOSP PERFORMED) - Abnormal; Notable for the following:    Tetrahydrocannabinol POSITIVE (*)    All other components within normal limits  URINE MICROSCOPIC-ADD ON - Abnormal; Notable for the following:    Squamous Epithelial / LPF FEW (*)    All other components within normal limits  CBC WITH DIFFERENTIAL/PLATELET  PREGNANCY, URINE    Imaging Review No results found.   EKG Interpretation   Date/Time:  Sunday March 24 2014 09:15:58 EST Ventricular Rate:  56 PR Interval:  140 QRS Duration: 86 QT Interval:  440 QTC Calculation: 425 R Axis:   38 Text Interpretation:  Sinus rhythm RSR' in V1 or V2,  No QT prolongation  as noted previously Confirmed by Fayrene FearingJAMES  MD, Carel Schnee ([id number]) on 03/24/2014  10:18:47 AM      MDM   Final diagnoses:  Non-intractable vomiting with nausea, vomiting of unspecified type  Seizure   Patient given antiemetic. Feeling considerably better. No abnormalities. She states she "finally "has insurance I  will allow her to be seen and evaluated. Given [facility]. Given a prescription for Keppra for her seizure disorder. Zofran for nausea. Return if any worsening or recurrence of symptoms.    Rolland PorterMark Inari Shin, MD 03/24/14 1212

## 2014-03-24 NOTE — ED Notes (Addendum)
Pt presents to department via [facility] for evaluation of generalized weakness/fatigue and seizure. Pt is homeless, states she had seizure this morning. Hasn't had medications for several days, also reports nausea/vomiting. Pt is alert and oriented x4. CBG 74.

## 2014-03-24 NOTE — Discharge Instructions (Signed)
Epilepsy People with epilepsy have times when they shake and jerk uncontrollably (seizures). This happens when there is a sudden change in brain function. Epilepsy may have many possible causes. Anything that disturbs the normal pattern of brain cell activity can lead to seizures. HOME CARE   Follow your doctor's instructions about driving and safety during normal activities.  Get enough sleep.  Only take medicine as told by your doctor.  Avoid things that you know can cause you to have seizures (triggers).  Write down when your seizures happen and what you remember about each seizure. Write down anything you think may have caused the seizure to happen.  Tell the people you live and work with that you have seizures. Make sure they know how to help you. They should:  Cushion your head and body.  Turn you on your side.  Not restrain you.  Not place anything inside your mouth.  Call for local emergency medical help if there is any question about what has happened.  Keep all follow-up visits with your doctor. This is very important. GET HELP IF:  You get an infection or start to feel sick. You may have more seizures when you are sick.  You are having seizures more often.  Your seizure pattern is changing. GET HELP RIGHT AWAY IF:   A seizure does not stop after a few seconds or minutes.  A seizure causes you to have trouble breathing.  A seizure gives you a very bad headache.  A seizure makes you unable to speak or use a part of your body. Document Released: 11/29/2008 Document Revised: 11/22/2012 Document Reviewed: 09/13/2012 [facility] Patient Information 2015 [place], [place]. This information is not intended to replace advice given to you by your health care provider. Make sure you discuss any questions you have with your health care provider.  Nausea and Vomiting Nausea means you feel sick to your stomach. Throwing up (vomiting) is a reflex where stomach contents come out  of your mouth. HOME CARE   Take medicine as told by your doctor.  Do not force yourself to eat. However, you do need to drink fluids.  If you feel like eating, eat a normal diet as told by your doctor.  Eat rice, wheat, potatoes, bread, lean meats, yogurt, fruits, and vegetables.  Avoid high-fat foods.  Drink enough fluids to keep your pee (urine) clear or pale yellow.  Ask your doctor how to replace body fluid losses (rehydrate). Signs of body fluid loss (dehydration) include:  Feeling very thirsty.  Dry lips and mouth.  Feeling dizzy.  Dark pee.  Peeing less than normal.  Feeling confused.  Fast breathing or heart rate. GET HELP RIGHT AWAY IF:   You have blood in your throw up.  You have black or bloody poop (stool).  You have a bad headache or stiff neck.  You feel confused.  You have bad belly (abdominal) pain.  You have chest pain or trouble breathing.  You do not pee at least once every 8 hours.  You have cold, clammy skin.  You keep throwing up after 24 to 48 hours.  You have a fever. MAKE SURE YOU:   Understand these instructions.  Will watch your condition.  Will get help right away if you are not doing well or get worse. Document Released: 07/21/2007 Document Revised: 04/26/2011 Document Reviewed: 07/03/2010 [facility] Patient Information 2015 [place], [place]. This information is not intended to replace advice given to you by your health care provider. Make  sure you discuss any questions you have with your health care provider.

## 2016-01-19 IMAGING — CR DG ANKLE COMPLETE 3+V*R*
3 series · 3 of 3 positions shown · non-contrast
Comparison: None.

CLINICAL DATA: Slipped and fell. Ankle injury. Ankle pain. Initial
encounter.

EXAM:
RIGHT ANKLE - COMPLETE 3+ VIEW

[ankle ap]
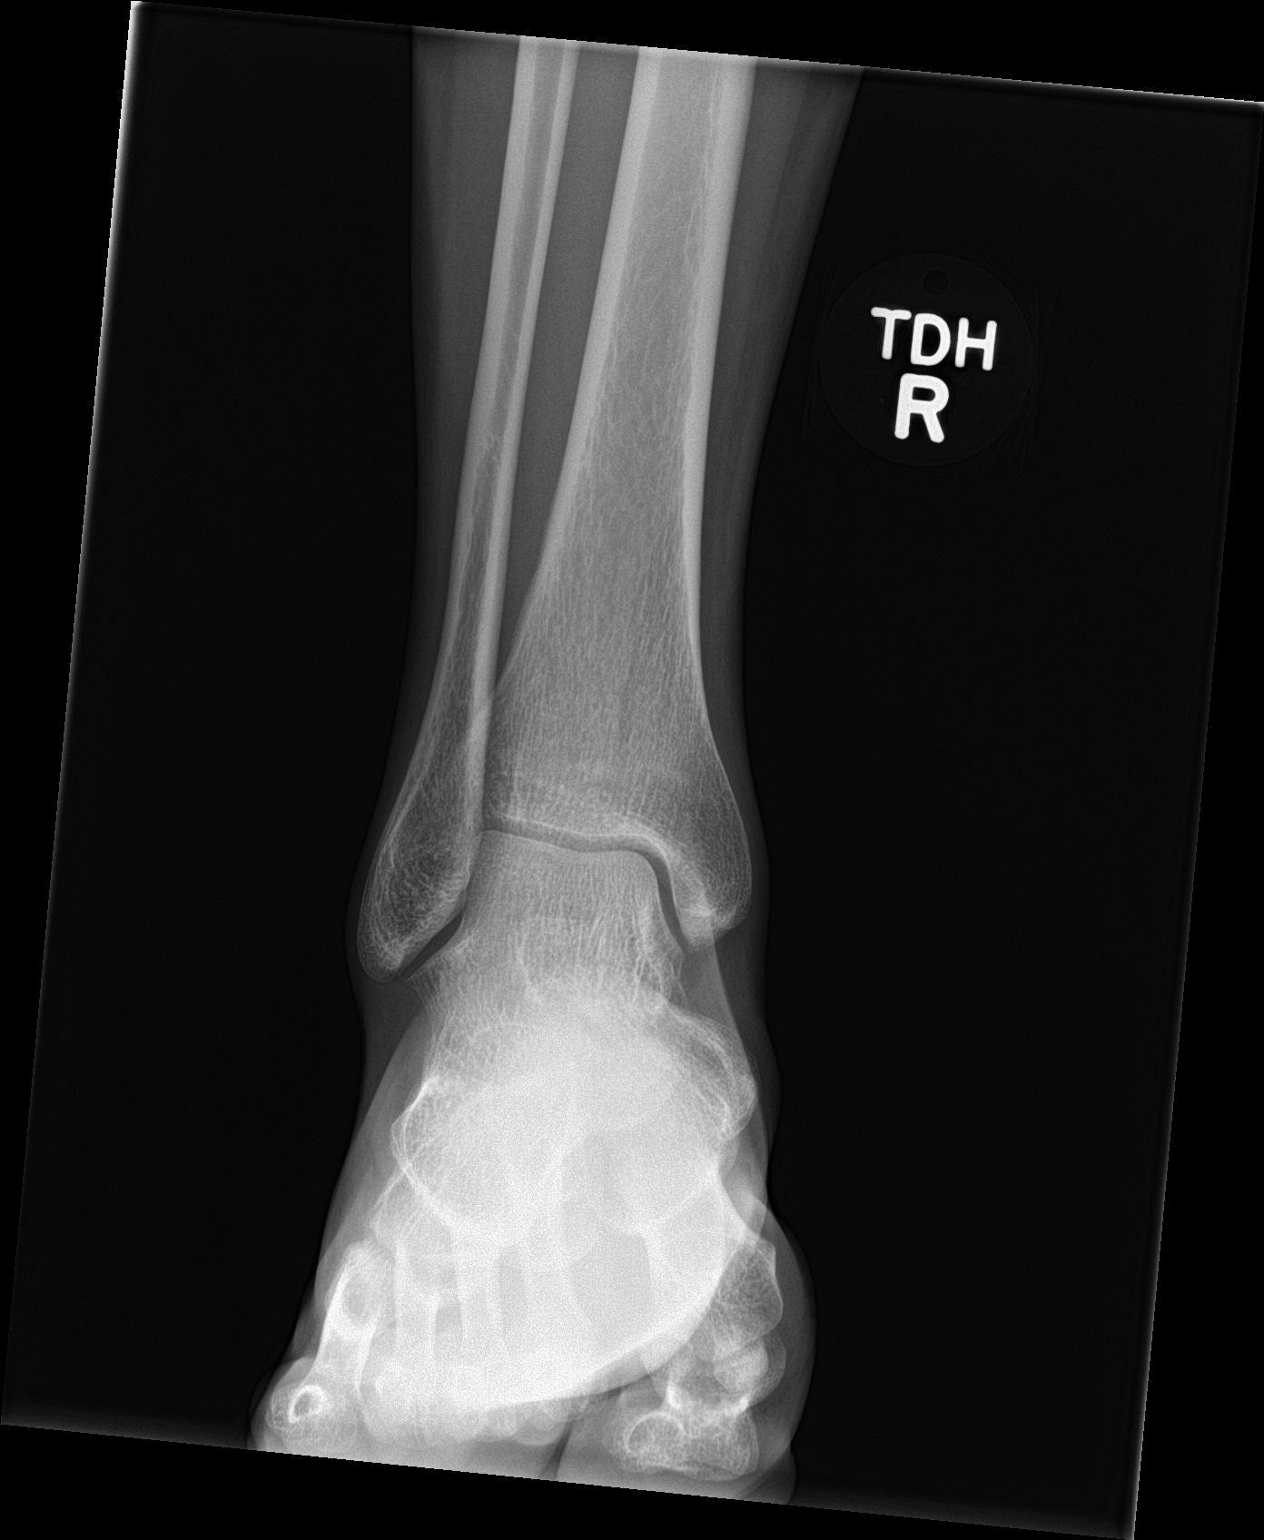

[ankle obl]
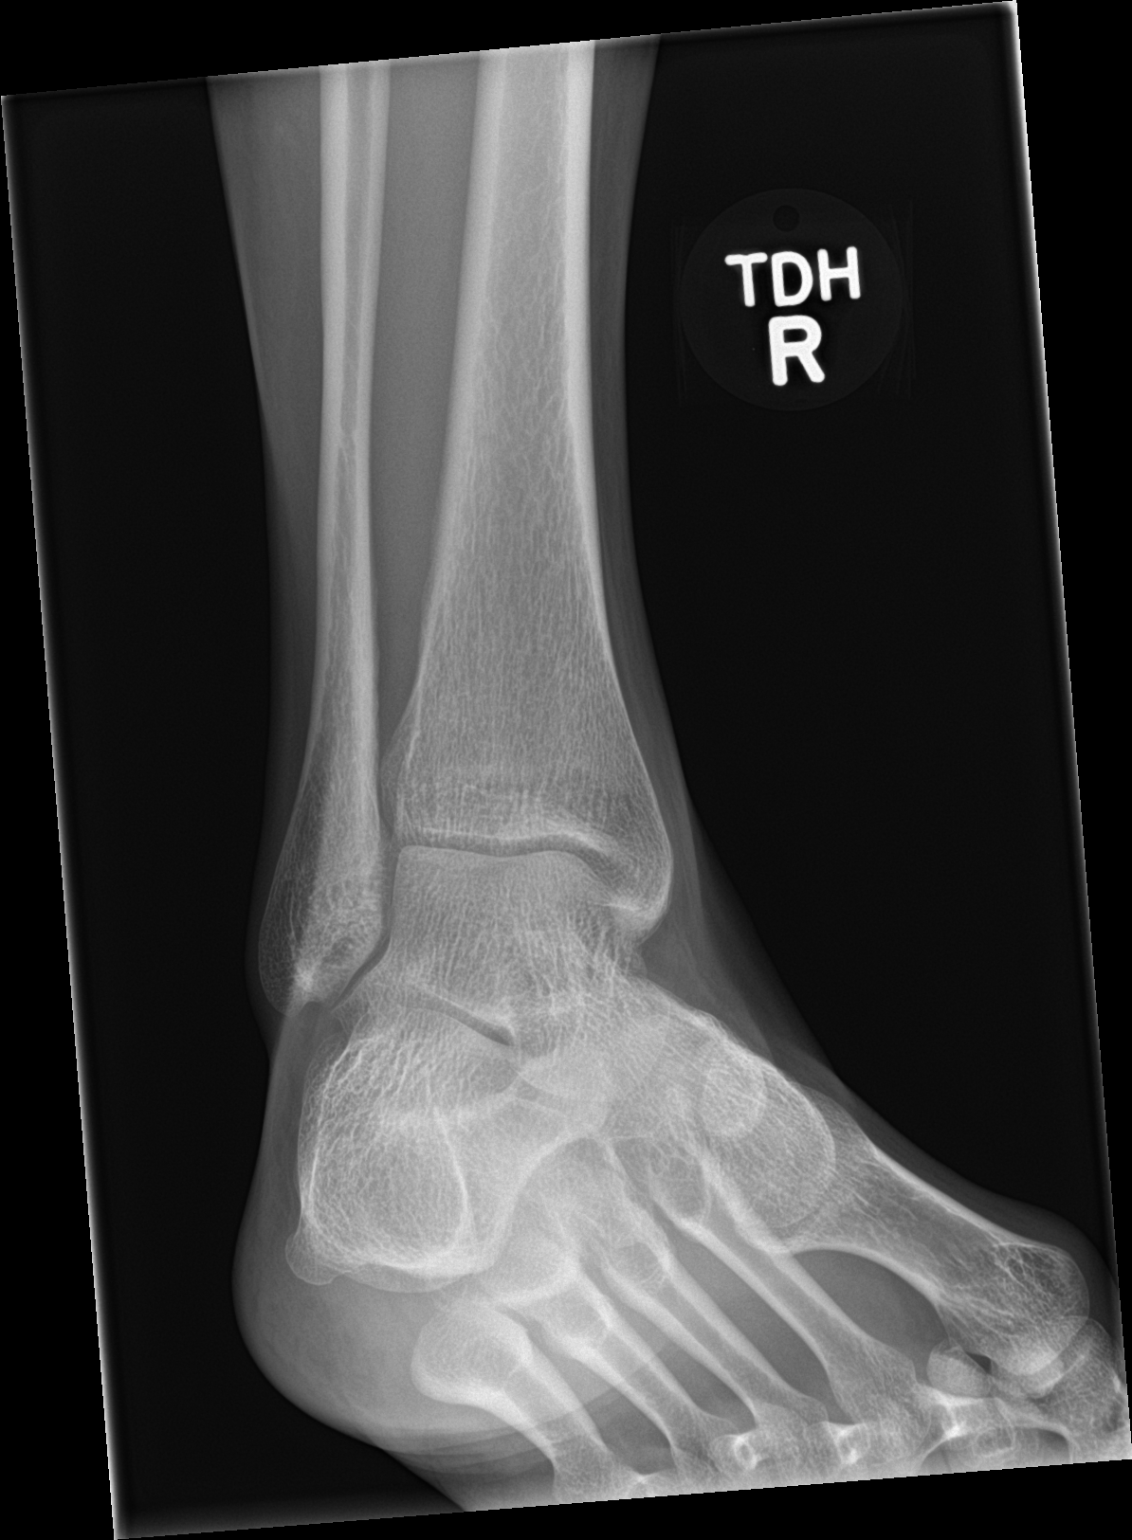

[ankle lat]
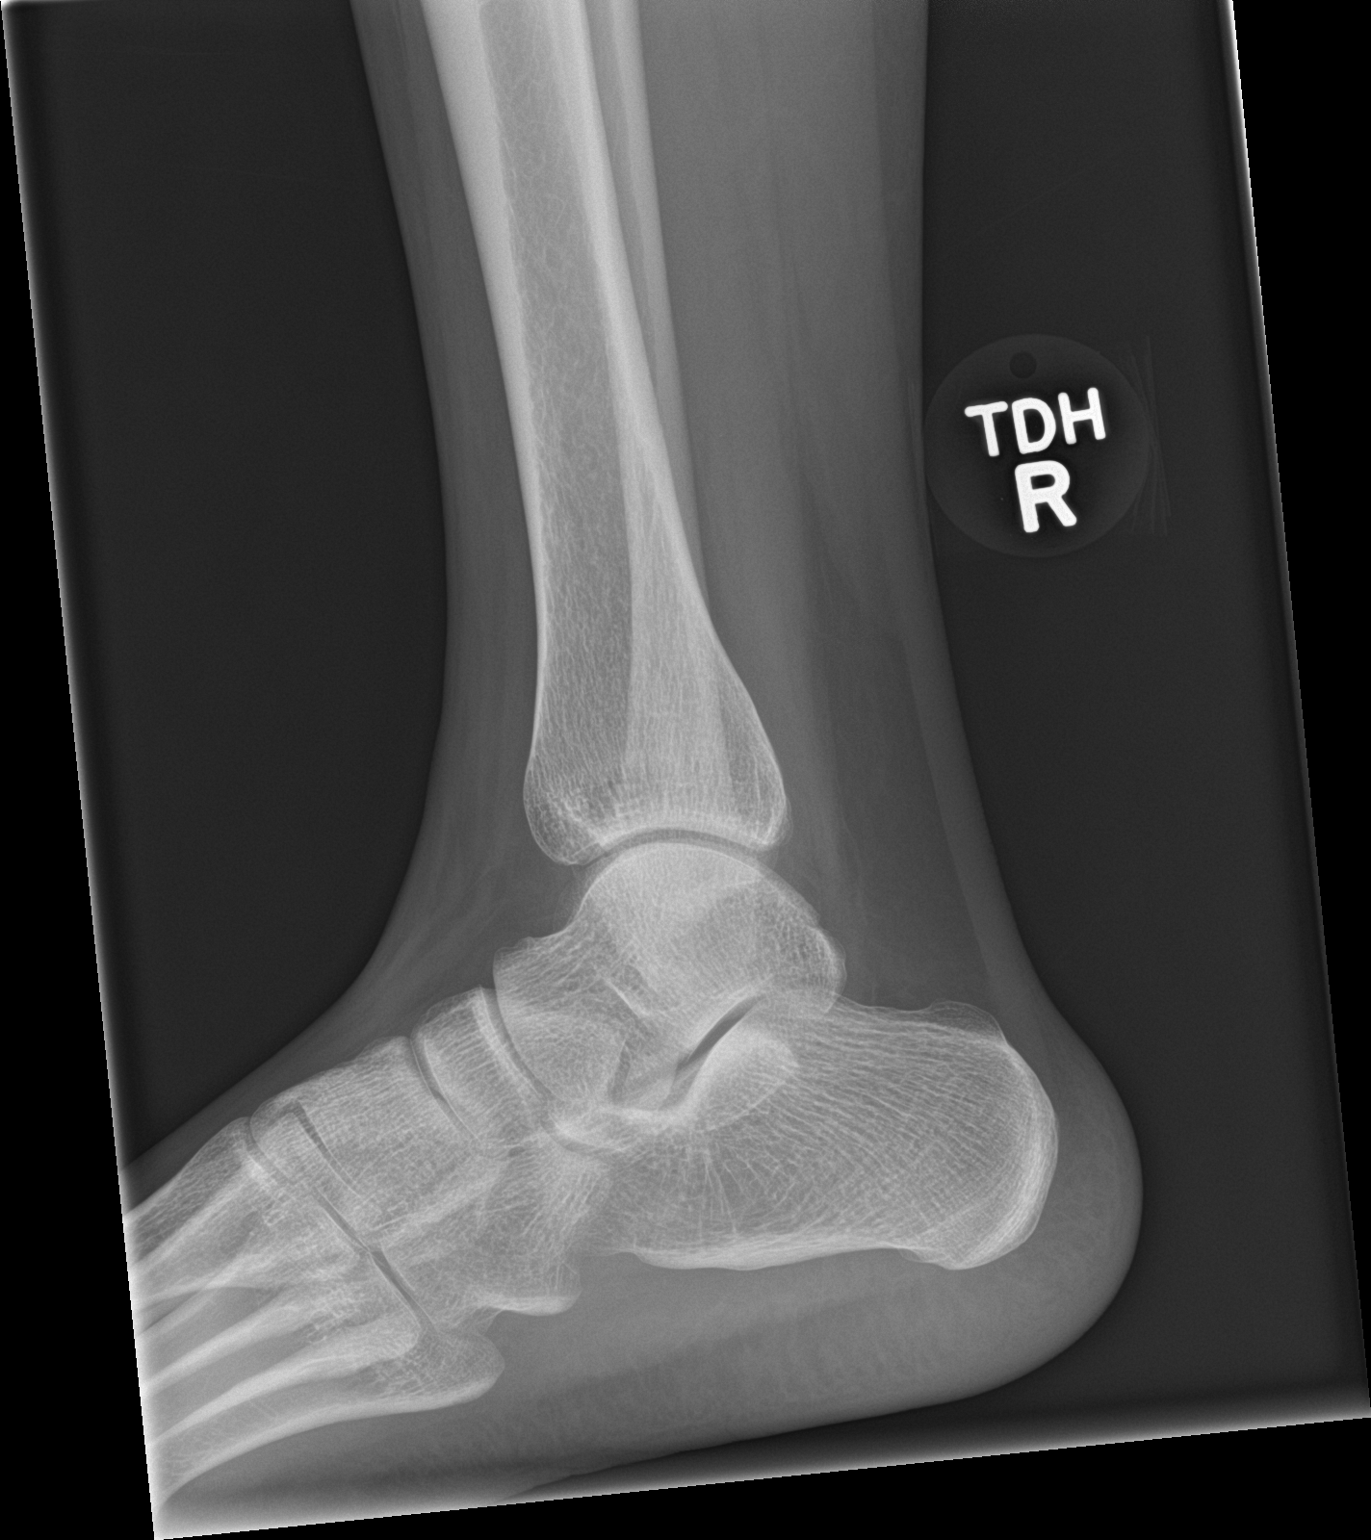

[3 of 3 positions shown; findings below may reference images not displayed]

FINDINGS: There is no evidence of fracture, dislocation, or joint effusion.
There is no evidence of arthropathy or other focal bone abnormality.
Soft tissues are unremarkable.
IMPRESSION: Negative.

## 2017-10-16 ENCOUNTER — Encounter (HOSPITAL_COMMUNITY): Payer: Self-pay | Admitting: Emergency Medicine

## 2017-10-16 ENCOUNTER — Ambulatory Visit (HOSPITAL_COMMUNITY)
Admission: EM | Admit: 2017-10-16 | Discharge: 2017-10-16 | Disposition: A | Discharge status: Home / Self Care | Payer: Medicaid Other | Attending: Family Medicine | Admitting: Family Medicine

## 2017-10-16 DIAGNOSIS — H60393 Other infective otitis externa, bilateral: Secondary | ICD-10-CM | POA: Diagnosis not present

## 2017-10-16 MED ORDER — NEOMYCIN-POLYMYXIN-HC 3.5-10000-1 OT SUSP: 4.0000 [drp] | Freq: Four times a day (QID) | OTIC | 1 refills | Status: AC

## 2017-10-16 MED ORDER — TOBRAMYCIN 0.3 % OP SOLN
OPHTHALMIC | Status: AC
  Filled 2017-10-16: qty 5

## 2017-10-16 NOTE — ED Notes (Signed)
Pt discharged by provider.
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
## 2017-10-16 NOTE — Discharge Instructions (Signed)
Use eardrops in both ears Continue the drops into your ears feel completely normal I have given you 1 refill in case she needs this If you have continued problems she will need to see an ear specialist.

## 2017-10-16 NOTE — ED Triage Notes (Signed)
Pt c/o bilateral ear pain for "a long time". Pt states her L ear is hurting worse.

## 2017-12-29 ENCOUNTER — Emergency Department (HOSPITAL_COMMUNITY)
Admission: EM | Admit: 2017-12-29 | Discharge: 2017-12-29 | Disposition: A | Discharge status: Home / Self Care | Payer: Medicaid Other | Attending: Emergency Medicine | Admitting: Emergency Medicine

## 2017-12-29 ENCOUNTER — Encounter (HOSPITAL_COMMUNITY): Payer: Self-pay | Admitting: Emergency Medicine

## 2017-12-29 DIAGNOSIS — R197 Diarrhea, unspecified: Secondary | ICD-10-CM | POA: Diagnosis not present

## 2017-12-29 DIAGNOSIS — E119 Type 2 diabetes mellitus without complications: Secondary | ICD-10-CM | POA: Diagnosis not present

## 2017-12-29 DIAGNOSIS — E876 Hypokalemia: Secondary | ICD-10-CM

## 2017-12-29 DIAGNOSIS — F17210 Nicotine dependence, cigarettes, uncomplicated: Secondary | ICD-10-CM | POA: Diagnosis not present

## 2017-12-29 DIAGNOSIS — Z79899 Other long term (current) drug therapy: Secondary | ICD-10-CM | POA: Diagnosis not present

## 2017-12-29 DIAGNOSIS — R519 Headache, unspecified: Secondary | ICD-10-CM

## 2017-12-29 DIAGNOSIS — Z202 Contact with and (suspected) exposure to infections with a predominantly sexual mode of transmission: Secondary | ICD-10-CM | POA: Insufficient documentation

## 2017-12-29 DIAGNOSIS — R51 Headache: Secondary | ICD-10-CM | POA: Diagnosis not present

## 2017-12-29 DIAGNOSIS — N3000 Acute cystitis without hematuria: Secondary | ICD-10-CM

## 2017-12-29 DIAGNOSIS — J45909 Unspecified asthma, uncomplicated: Secondary | ICD-10-CM | POA: Insufficient documentation

## 2017-12-29 DIAGNOSIS — R112 Nausea with vomiting, unspecified: Secondary | ICD-10-CM

## 2017-12-29 DIAGNOSIS — Z113 Encounter for screening for infections with a predominantly sexual mode of transmission: Secondary | ICD-10-CM

## 2017-12-29 LAB — CBC WITH DIFFERENTIAL/PLATELET
ABS IMMATURE GRANULOCYTES: 0.02 10*3/uL (ref 0.00–0.07)
Basophils Absolute: 0 10*3/uL (ref 0.0–0.1)
Basophils Relative: 0 %
Eosinophils Absolute: 0.1 10*3/uL (ref 0.0–0.5)
Eosinophils Relative: 1 %
HEMATOCRIT: 39.8 % (ref 36.0–46.0)
Hemoglobin: 13 g/dL (ref 12.0–15.0)
Immature Granulocytes: 0 %
LYMPHS ABS: 2.5 10*3/uL (ref 0.7–4.0)
Lymphocytes Relative: 35 %
MCH: 28.8 pg (ref 26.0–34.0)
MCHC: 32.7 g/dL (ref 30.0–36.0)
MCV: 88.2 fL (ref 80.0–100.0)
MONO ABS: 0.5 10*3/uL (ref 0.1–1.0)
MONOS PCT: 8 %
Neutro Abs: 4 10*3/uL (ref 1.7–7.7)
Neutrophils Relative %: 56 %
Platelets: 374 10*3/uL (ref 150–400)
RBC: 4.51 MIL/uL (ref 3.87–5.11)
RDW: 14.6 % (ref 11.5–15.5)
WBC: 7.2 10*3/uL (ref 4.0–10.5)
nRBC: 0 % (ref 0.0–0.2)

## 2017-12-29 LAB — I-STAT BETA HCG BLOOD, ED (MC, WL, AP ONLY)

## 2017-12-29 LAB — LIPASE, BLOOD: Lipase: 22 U/L (ref 11–51)

## 2017-12-29 LAB — COMPREHENSIVE METABOLIC PANEL
ALK PHOS: 63 U/L (ref 38–126)
ALT: 11 U/L (ref 0–44)
ANION GAP: 7 (ref 5–15)
AST: 19 U/L (ref 15–41)
Albumin: 3.4 g/dL — ABNORMAL LOW (ref 3.5–5.0)
BILIRUBIN TOTAL: 0.5 mg/dL (ref 0.3–1.2)
BUN: <5 mg/dL — ABNORMAL LOW (ref 6–20)
CALCIUM: 8.6 mg/dL — AB (ref 8.9–10.3)
CO2: 27 mmol/L (ref 22–32)
CREATININE: 0.89 mg/dL (ref 0.44–1.00)
Chloride: 107 mmol/L (ref 98–111)
GFR calc non Af Amer: >60 mL/min (ref >60)
GLUCOSE: 112 mg/dL — AB (ref 70–99)
Potassium: 3.1 mmol/L — ABNORMAL LOW (ref 3.5–5.1)
SODIUM: 141 mmol/L (ref 135–145)
TOTAL PROTEIN: 6.2 g/dL — AB (ref 6.5–8.1)

## 2017-12-29 LAB — URINALYSIS, ROUTINE W REFLEX MICROSCOPIC
Bilirubin Urine: NEGATIVE
Glucose, UA: NEGATIVE mg/dL
Hgb urine dipstick: NEGATIVE
Ketones, ur: NEGATIVE mg/dL
Nitrite: POSITIVE — AB
PROTEIN: NEGATIVE mg/dL
SPECIFIC GRAVITY, URINE: 1.015 (ref 1.005–1.030)
pH: 6 (ref 5.0–8.0)

## 2017-12-29 MED ORDER — ONDANSETRON 4 MG PO TBDP: ORAL_TABLET | ORAL | 0 refills | Status: AC

## 2017-12-29 MED ORDER — KETOROLAC TROMETHAMINE 30 MG/ML IJ SOLN
30.0000 mg | Freq: Once | INTRAMUSCULAR | Status: AC
  Administered 2017-12-29: 30 mg via INTRAVENOUS
  Filled 2017-12-29: qty 1

## 2017-12-29 MED ORDER — POTASSIUM CHLORIDE CRYS ER 20 MEQ PO TBCR
40.0000 meq | EXTENDED_RELEASE_TABLET | Freq: Once | ORAL | Status: AC
  Administered 2017-12-29: 40 meq via ORAL
  Filled 2017-12-29: qty 2

## 2017-12-29 MED ORDER — SODIUM CHLORIDE 0.9 % IV BOLUS
1000.0000 mL | Freq: Once | INTRAVENOUS | Status: AC
  Administered 2017-12-29: 1000 mL via INTRAVENOUS

## 2017-12-29 MED ORDER — ONDANSETRON HCL 4 MG/2ML IJ SOLN
4.0000 mg | Freq: Once | INTRAMUSCULAR | Status: AC
  Administered 2017-12-29: 4 mg via INTRAVENOUS
  Filled 2017-12-29: qty 2

## 2017-12-29 MED ORDER — CEPHALEXIN 500 MG PO CAPS: 500.0000 mg | ORAL_CAPSULE | Freq: Three times a day (TID) | ORAL | 0 refills | Status: AC

## 2017-12-29 NOTE — ED Triage Notes (Signed)
Pt presents to ED for assessment of 5 days of nausea, emesis and headaches (hx of migraines).  Patient states she would also like an STD test, denies any symptoms, just "want to prove something to a friend" and also states she needs another inhaler.

## 2017-12-29 NOTE — Discharge Instructions (Addendum)
Your work-up is overall reassuring, labs show mildly low potassium which was replaced here in the emergency department you do have a urinary tract infection, please take antibiotics as directed.  You may use Zofran as needed for nausea.  You have STD testing pending will be contacted in 2 to 3 days if any results are positive at which time you may report to the health department for treatment you will need to notify any partners so they can be tested as well.  Return to the emergency department if you have persistently worsening vomiting and diarrhea, abdominal pain, blood in your stools or vomit, fevers or any other new or concerning symptoms.

## 2017-12-29 NOTE — ED Notes (Signed)
Pt provided with crackers and water for PO challenge.  

## 2017-12-29 NOTE — ED Notes (Signed)
Pt stable, ambulatory, states understanding of discharge instructions 
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
## 2017-12-30 LAB — GC/CHLAMYDIA PROBE AMP (~~LOC~~) NOT AT ARMC
Chlamydia: NEGATIVE
Neisseria Gonorrhea: NEGATIVE

## 2017-12-30 LAB — RPR: RPR: NONREACTIVE

## 2017-12-30 LAB — HIV ANTIBODY (ROUTINE TESTING W REFLEX): HIV Screen 4th Generation wRfx: NONREACTIVE
